# Patient Record
Sex: Female | Born: 1965
Health system: Southern US, Community
[De-identification: ages and names within clinical notes are randomized; demographics above are authoritative.]

## PROBLEM LIST (undated history)

## (undated) DIAGNOSIS — F419 Anxiety disorder, unspecified: Secondary | ICD-10-CM

## (undated) DIAGNOSIS — Z923 Personal history of irradiation: Secondary | ICD-10-CM

## (undated) DIAGNOSIS — N63 Unspecified lump in unspecified breast: Secondary | ICD-10-CM

## (undated) DIAGNOSIS — C50919 Malignant neoplasm of unspecified site of unspecified female breast: Secondary | ICD-10-CM

## (undated) DIAGNOSIS — C801 Malignant (primary) neoplasm, unspecified: Secondary | ICD-10-CM

## (undated) HISTORY — PX: BREAST SURGERY: SHX581

## (undated) HISTORY — PX: BREAST LUMPECTOMY: SHX2

---

## 1997-09-16 ENCOUNTER — Encounter: Admission: RE | Admit: 1997-09-16 | Discharge: 1997-10-08 | Payer: Self-pay | Admitting: Obstetrics and Gynecology

## 1999-07-15 ENCOUNTER — Other Ambulatory Visit: Admission: RE | Admit: 1999-07-15 | Discharge: 1999-07-15 | Payer: Self-pay | Admitting: Obstetrics and Gynecology

## 2000-07-06 ENCOUNTER — Other Ambulatory Visit: Admission: RE | Admit: 2000-07-06 | Discharge: 2000-07-06 | Payer: Self-pay | Admitting: Obstetrics and Gynecology

## 2001-08-01 ENCOUNTER — Other Ambulatory Visit: Admission: RE | Admit: 2001-08-01 | Discharge: 2001-08-01 | Payer: Self-pay | Admitting: Obstetrics and Gynecology

## 2001-08-04 ENCOUNTER — Encounter: Payer: Self-pay | Admitting: Obstetrics and Gynecology

## 2001-08-04 ENCOUNTER — Encounter: Admission: RE | Admit: 2001-08-04 | Discharge: 2001-08-04 | Payer: Self-pay | Admitting: Obstetrics and Gynecology

## 2001-09-18 ENCOUNTER — Encounter
Admission: RE | Admit: 2001-09-18 | Discharge: 2001-09-18 | Payer: Self-pay | Admitting: Physical Medicine & Rehabilitation

## 2001-09-18 ENCOUNTER — Encounter: Payer: Self-pay | Admitting: Physical Medicine & Rehabilitation

## 2002-07-31 ENCOUNTER — Other Ambulatory Visit: Admission: RE | Admit: 2002-07-31 | Discharge: 2002-07-31 | Payer: Self-pay | Admitting: Obstetrics and Gynecology

## 2003-08-06 ENCOUNTER — Other Ambulatory Visit
Admission: RE | Admit: 2003-08-06 | Discharge: 2003-08-06 | Payer: Self-pay | Admitting: Physical Medicine & Rehabilitation

## 2004-11-17 ENCOUNTER — Other Ambulatory Visit: Admission: RE | Admit: 2004-11-17 | Discharge: 2004-11-17 | Payer: Self-pay | Admitting: Obstetrics and Gynecology

## 2005-06-10 ENCOUNTER — Ambulatory Visit: Payer: Self-pay | Admitting: Family Medicine

## 2005-08-27 ENCOUNTER — Other Ambulatory Visit: Admission: RE | Admit: 2005-08-27 | Discharge: 2005-08-27 | Payer: Self-pay | Admitting: Obstetrics and Gynecology

## 2006-06-01 ENCOUNTER — Encounter: Admission: RE | Admit: 2006-06-01 | Discharge: 2006-06-01 | Payer: Self-pay | Admitting: Obstetrics and Gynecology

## 2006-07-04 ENCOUNTER — Encounter: Admission: RE | Admit: 2006-07-04 | Discharge: 2006-07-04 | Payer: Self-pay | Admitting: Obstetrics and Gynecology

## 2006-10-27 ENCOUNTER — Other Ambulatory Visit: Admission: RE | Admit: 2006-10-27 | Discharge: 2006-10-27 | Payer: Self-pay | Admitting: Obstetrics and Gynecology

## 2007-01-17 ENCOUNTER — Encounter: Admission: RE | Admit: 2007-01-17 | Discharge: 2007-01-17 | Payer: Self-pay | Admitting: Obstetrics and Gynecology

## 2007-06-07 ENCOUNTER — Encounter: Admission: RE | Admit: 2007-06-07 | Discharge: 2007-06-07 | Payer: Self-pay | Admitting: Obstetrics and Gynecology

## 2007-12-21 ENCOUNTER — Other Ambulatory Visit: Admission: RE | Admit: 2007-12-21 | Discharge: 2007-12-21 | Payer: Self-pay | Admitting: Obstetrics and Gynecology

## 2008-08-20 ENCOUNTER — Encounter: Admission: RE | Admit: 2008-08-20 | Discharge: 2008-08-20 | Payer: Self-pay | Admitting: Obstetrics and Gynecology

## 2009-01-07 ENCOUNTER — Other Ambulatory Visit: Admission: RE | Admit: 2009-01-07 | Discharge: 2009-01-07 | Payer: Self-pay | Admitting: Obstetrics and Gynecology

## 2009-11-14 ENCOUNTER — Encounter: Admission: RE | Admit: 2009-11-14 | Discharge: 2009-11-14 | Payer: Self-pay | Admitting: Obstetrics and Gynecology

## 2010-08-16 HISTORY — PX: AUGMENTATION MAMMAPLASTY: SUR837

## 2010-12-22 ENCOUNTER — Other Ambulatory Visit: Payer: Self-pay | Admitting: Obstetrics and Gynecology

## 2010-12-22 ENCOUNTER — Other Ambulatory Visit (HOSPITAL_COMMUNITY)
Admission: RE | Admit: 2010-12-22 | Discharge: 2010-12-22 | Disposition: A | Discharge status: Home / Self Care | Payer: BC Managed Care – PPO | Source: Ambulatory Visit | Attending: Obstetrics and Gynecology | Admitting: Obstetrics and Gynecology

## 2010-12-22 DIAGNOSIS — Z01419 Encounter for gynecological examination (general) (routine) without abnormal findings: Secondary | ICD-10-CM | POA: Insufficient documentation

## 2012-01-05 ENCOUNTER — Other Ambulatory Visit: Payer: Self-pay | Admitting: Obstetrics and Gynecology

## 2012-01-05 ENCOUNTER — Other Ambulatory Visit (HOSPITAL_COMMUNITY)
Admission: RE | Admit: 2012-01-05 | Discharge: 2012-01-05 | Disposition: A | Discharge status: Home / Self Care | Payer: PRIVATE HEALTH INSURANCE | Source: Ambulatory Visit | Attending: Obstetrics and Gynecology | Admitting: Obstetrics and Gynecology

## 2012-01-05 DIAGNOSIS — Z01419 Encounter for gynecological examination (general) (routine) without abnormal findings: Secondary | ICD-10-CM | POA: Insufficient documentation

## 2012-06-02 ENCOUNTER — Other Ambulatory Visit: Payer: Self-pay | Admitting: Obstetrics and Gynecology

## 2012-06-02 DIAGNOSIS — Z1231 Encounter for screening mammogram for malignant neoplasm of breast: Secondary | ICD-10-CM

## 2012-06-08 ENCOUNTER — Ambulatory Visit
Admission: RE | Admit: 2012-06-08 | Discharge: 2012-06-08 | Disposition: A | Discharge status: Home / Self Care | Payer: PRIVATE HEALTH INSURANCE | Source: Ambulatory Visit | Attending: Obstetrics and Gynecology | Admitting: Obstetrics and Gynecology

## 2012-06-08 DIAGNOSIS — Z1231 Encounter for screening mammogram for malignant neoplasm of breast: Secondary | ICD-10-CM

## 2013-01-04 ENCOUNTER — Other Ambulatory Visit: Payer: Self-pay | Admitting: Obstetrics and Gynecology

## 2013-01-04 ENCOUNTER — Other Ambulatory Visit (HOSPITAL_COMMUNITY)
Admission: RE | Admit: 2013-01-04 | Discharge: 2013-01-04 | Disposition: A | Discharge status: Home / Self Care | Payer: PRIVATE HEALTH INSURANCE | Source: Ambulatory Visit | Attending: Obstetrics and Gynecology | Admitting: Obstetrics and Gynecology

## 2013-01-04 DIAGNOSIS — Z1151 Encounter for screening for human papillomavirus (HPV): Secondary | ICD-10-CM | POA: Insufficient documentation

## 2013-01-04 DIAGNOSIS — Z01419 Encounter for gynecological examination (general) (routine) without abnormal findings: Secondary | ICD-10-CM | POA: Insufficient documentation

## 2013-07-17 ENCOUNTER — Other Ambulatory Visit: Payer: Self-pay

## 2013-07-17 DIAGNOSIS — Z1231 Encounter for screening mammogram for malignant neoplasm of breast: Secondary | ICD-10-CM

## 2013-08-17 ENCOUNTER — Other Ambulatory Visit: Payer: Self-pay

## 2013-08-17 ENCOUNTER — Ambulatory Visit
Admission: RE | Admit: 2013-08-17 | Discharge: 2013-08-17 | Disposition: A | Discharge status: Home / Self Care | Payer: PRIVATE HEALTH INSURANCE | Source: Ambulatory Visit

## 2013-08-17 ENCOUNTER — Ambulatory Visit: Payer: PRIVATE HEALTH INSURANCE

## 2013-08-17 DIAGNOSIS — Z1231 Encounter for screening mammogram for malignant neoplasm of breast: Secondary | ICD-10-CM

## 2014-01-18 ENCOUNTER — Other Ambulatory Visit (HOSPITAL_COMMUNITY)
Admission: RE | Admit: 2014-01-18 | Discharge: 2014-01-18 | Disposition: A | Discharge status: Home / Self Care | Payer: PRIVATE HEALTH INSURANCE | Source: Ambulatory Visit | Attending: Obstetrics and Gynecology | Admitting: Obstetrics and Gynecology

## 2014-01-18 ENCOUNTER — Other Ambulatory Visit: Payer: Self-pay | Admitting: Obstetrics and Gynecology

## 2014-01-18 DIAGNOSIS — Z01419 Encounter for gynecological examination (general) (routine) without abnormal findings: Secondary | ICD-10-CM | POA: Insufficient documentation

## 2014-01-21 LAB — CYTOLOGY - PAP

## 2014-10-23 ENCOUNTER — Other Ambulatory Visit: Payer: Self-pay

## 2014-10-23 DIAGNOSIS — Z1231 Encounter for screening mammogram for malignant neoplasm of breast: Secondary | ICD-10-CM

## 2014-11-11 ENCOUNTER — Ambulatory Visit
Admission: RE | Admit: 2014-11-11 | Discharge: 2014-11-11 | Disposition: A | Discharge status: Home / Self Care | Payer: PRIVATE HEALTH INSURANCE | Source: Ambulatory Visit

## 2014-11-11 DIAGNOSIS — Z1231 Encounter for screening mammogram for malignant neoplasm of breast: Secondary | ICD-10-CM

## 2015-03-21 ENCOUNTER — Other Ambulatory Visit (HOSPITAL_COMMUNITY)
Admission: RE | Admit: 2015-03-21 | Discharge: 2015-03-21 | Disposition: A | Discharge status: Home / Self Care | Payer: PRIVATE HEALTH INSURANCE | Source: Ambulatory Visit | Attending: Obstetrics and Gynecology | Admitting: Obstetrics and Gynecology

## 2015-03-21 ENCOUNTER — Other Ambulatory Visit: Payer: Self-pay | Admitting: Obstetrics and Gynecology

## 2015-03-21 DIAGNOSIS — Z01419 Encounter for gynecological examination (general) (routine) without abnormal findings: Secondary | ICD-10-CM | POA: Diagnosis not present

## 2015-03-24 LAB — CYTOLOGY - PAP

## 2015-12-08 ENCOUNTER — Other Ambulatory Visit: Payer: Self-pay | Admitting: Internal Medicine

## 2015-12-08 DIAGNOSIS — Z130 Encounter for screening for diseases of the blood and blood-forming organs and certain disorders involving the immune mechanism: Secondary | ICD-10-CM

## 2015-12-08 DIAGNOSIS — Z13220 Encounter for screening for lipoid disorders: Secondary | ICD-10-CM

## 2015-12-08 DIAGNOSIS — Z0000 Encounter for general adult medical examination without abnormal findings: Secondary | ICD-10-CM

## 2015-12-08 DIAGNOSIS — Z1329 Encounter for screening for other suspected endocrine disorder: Secondary | ICD-10-CM

## 2015-12-08 DIAGNOSIS — Z1321 Encounter for screening for nutritional disorder: Secondary | ICD-10-CM

## 2015-12-08 LAB — TSH: TSH: 1.64 m[IU]/L

## 2015-12-08 LAB — COMPLETE METABOLIC PANEL WITH GFR
ALBUMIN: 4.3 g/dL (ref 3.6–5.1)
ALK PHOS: 75 U/L (ref 33–130)
ALT: 30 U/L — ABNORMAL HIGH (ref 6–29)
AST: 27 U/L (ref 10–35)
BUN: 10 mg/dL (ref 7–25)
CHLORIDE: 106 mmol/L (ref 98–110)
CO2: 27 mmol/L (ref 20–31)
Calcium: 9.4 mg/dL (ref 8.6–10.4)
Creat: 0.76 mg/dL (ref 0.50–1.05)
GFR, Est African American: >89 mL/min (ref >=60)
GLUCOSE: 83 mg/dL (ref 65–99)
POTASSIUM: 4.3 mmol/L (ref 3.5–5.3)
SODIUM: 143 mmol/L (ref 135–146)
Total Bilirubin: 0.8 mg/dL (ref 0.2–1.2)
Total Protein: 6.5 g/dL (ref 6.1–8.1)

## 2015-12-08 LAB — LIPID PANEL
CHOLESTEROL: 184 mg/dL (ref 125–200)
HDL: 92 mg/dL (ref >=46)
LDL Cholesterol: 68 mg/dL (ref <130)
Total CHOL/HDL Ratio: 2 Ratio (ref <=5.0)
Triglycerides: 118 mg/dL (ref <150)
VLDL: 24 mg/dL (ref <30)

## 2015-12-08 LAB — CBC WITH DIFFERENTIAL/PLATELET
BASOS ABS: 0 {cells}/uL (ref 0–200)
Basophils Relative: 0 %
EOS PCT: 4 %
Eosinophils Absolute: 188 cells/uL (ref 15–500)
HCT: 42.6 % (ref 35.0–45.0)
HEMOGLOBIN: 14.3 g/dL (ref 11.7–15.5)
LYMPHS ABS: 1927 {cells}/uL (ref 850–3900)
LYMPHS PCT: 41 %
MCH: 30.8 pg (ref 27.0–33.0)
MCHC: 33.6 g/dL (ref 32.0–36.0)
MCV: 91.6 fL (ref 80.0–100.0)
MONOS PCT: 10 %
MPV: 9.9 fL (ref 7.5–12.5)
Monocytes Absolute: 470 cells/uL (ref 200–950)
NEUTROS PCT: 45 %
Neutro Abs: 2115 cells/uL (ref 1500–7800)
Platelets: 242 10*3/uL (ref 140–400)
RBC: 4.65 MIL/uL (ref 3.80–5.10)
RDW: 13.3 % (ref 11.0–15.0)
WBC: 4.7 10*3/uL (ref 3.8–10.8)

## 2015-12-09 LAB — VITAMIN D 25 HYDROXY (VIT D DEFICIENCY, FRACTURES): Vit D, 25-Hydroxy: 24 ng/mL — ABNORMAL LOW (ref 30–100)

## 2015-12-11 ENCOUNTER — Other Ambulatory Visit (HOSPITAL_COMMUNITY)
Admission: RE | Admit: 2015-12-11 | Discharge: 2015-12-11 | Disposition: A | Discharge status: Home / Self Care | Payer: PRIVATE HEALTH INSURANCE | Source: Ambulatory Visit | Attending: Internal Medicine | Admitting: Internal Medicine

## 2015-12-11 ENCOUNTER — Ambulatory Visit (INDEPENDENT_AMBULATORY_CARE_PROVIDER_SITE_OTHER): Payer: PRIVATE HEALTH INSURANCE | Admitting: Internal Medicine

## 2015-12-11 ENCOUNTER — Encounter: Payer: Self-pay | Admitting: Internal Medicine

## 2015-12-11 ENCOUNTER — Other Ambulatory Visit: Payer: Self-pay | Admitting: Internal Medicine

## 2015-12-11 VITALS — BP 118/88 | HR 75 | Temp 98.6°F | Resp 18 | Ht 65.5 in | Wt 139.5 lb

## 2015-12-11 DIAGNOSIS — Z124 Encounter for screening for malignant neoplasm of cervix: Secondary | ICD-10-CM

## 2015-12-11 DIAGNOSIS — F411 Generalized anxiety disorder: Secondary | ICD-10-CM | POA: Diagnosis not present

## 2015-12-11 DIAGNOSIS — Z0000 Encounter for general adult medical examination without abnormal findings: Secondary | ICD-10-CM

## 2015-12-11 DIAGNOSIS — R0789 Other chest pain: Secondary | ICD-10-CM | POA: Diagnosis not present

## 2015-12-11 DIAGNOSIS — B360 Pityriasis versicolor: Secondary | ICD-10-CM | POA: Diagnosis not present

## 2015-12-11 DIAGNOSIS — Z01419 Encounter for gynecological examination (general) (routine) without abnormal findings: Secondary | ICD-10-CM | POA: Insufficient documentation

## 2015-12-11 MED ORDER — ALPRAZOLAM 0.5 MG PO TABS: ORAL_TABLET | ORAL | Status: DC

## 2015-12-11 MED ORDER — KETOCONAZOLE 2 % EX SHAM: MEDICATED_SHAMPOO | CUTANEOUS | Status: DC

## 2015-12-11 MED ORDER — ESCITALOPRAM OXALATE 10 MG PO TABS: 10.0000 mg | ORAL_TABLET | Freq: Every day | ORAL | Status: DC

## 2015-12-11 MED ORDER — KETOCONAZOLE 2 % EX SHAM: 1.0000 "application " | MEDICATED_SHAMPOO | CUTANEOUS | Status: DC

## 2015-12-11 NOTE — Progress Notes (Signed)
Subjective:    Patient ID: [name], female    DOB: 1966-08-04, 50 y.o.   MRN: [id number]  HPI 50 year old White Female presents to the office for the first time today. Both of her sisters are patients here. One sister  Is  [name]  who is married to Dr. Arloa Koh, Radiation Oncologist. Patient's husband arrested September 2016 enroute to the Emergency Department after calling EMS with complaint of chest pain. He apparenty expired of a sudden cardiac event. He was 51 years old.Patient was not home at the time. She was traveling for work. She did speak with him and advised him to call for help. She has 2 sons. She continues to work full-time. She's done amazingly well.  However recently she's had some chest pain as well. She would like to get this evaluated. Maternal grandmother had history of heart disease.  Patient says she has a history of polycystic ovary syndrome. GYN records do not say that. They indicate she has a history of an ovarian cyst. Was on oral contraceptives until about 2 months ago. Has not had a menstrual period since stopping oral contraceptives. Has seen [name] in August 2016.  She had transvaginal ultrasound in 2002 that showed a right ovarian cyst She also had multiple clear follicular cyst both ovaries. By 2003 transvaginal ultrasound showed resolution of right ovarian cyst.  History of bilateral breast implants 2012  Patient does not smoke. Social alcohol consumption. She is senior Tree surgeon for [employer]  Family history: Father died at age 59 due to myeloproliferative disorder. Mother healthy. 2 sisters ages 15 and 46 in excellent health. 2 sons ages 31 and 9 both in good health.  GYN records indicate she has history of HSV treated with Valtrex when necessary  Patient previously was on killed S FE 1.5/30 oral contraceptives until 2 months ago.  Last mammogram March 2016  Was tried on Lexapro after husband passed away but did not  take it very long         Review of Systems last menstrual period was 2 months ago when she was on oral contraceptives. No history of significant hirsutism. May be some slight hair thinning     Objective:   Physical Exam  Constitutional: She is oriented to person, place, and time. She appears well-developed and well-nourished. No distress.  HENT:  Head: Normocephalic and atraumatic.  Right Ear: External ear normal.  Left Ear: External ear normal.  Mouth/Throat: No oropharyngeal exudate.  Eyes: Conjunctivae and EOM are normal. Pupils are equal, round, and reactive to light. Right eye exhibits no discharge. Left eye exhibits no discharge.  Neck: Neck supple. No JVD present. No thyromegaly present.  Cardiovascular: Normal rate, regular rhythm and normal heart sounds.   No murmur heard. Pulmonary/Chest: Effort normal and breath sounds normal. No respiratory distress. She has no wheezes. She has no rales.  Bilateral breast implants  Abdominal: Soft. Bowel sounds are normal. She exhibits no distension and no mass. There is no tenderness. There is no rebound and no guarding.  Genitourinary:  Pap taken. Bimanual normal.  Musculoskeletal: She exhibits no edema.  Lymphadenopathy:    She has no cervical adenopathy.  Neurological: She is alert and oriented to person, place, and time. She has normal reflexes. No cranial nerve deficit. Coordination normal.  Skin: Skin is warm and dry. She is not diaphoretic.  Tinea versicolor  Psychiatric: She has a normal mood and affect. Her behavior is normal. Judgment  and thought content normal.  Vitals reviewed.         Assessment & Plan:  History of polycystic ovary syndrome-patient wants definitive diagnosis. We'll discuss at next visit how to go about evaluating this further. Really doesn't have any significant hirsutism. Consider serum testosterone level. Consider screening for diabetes. Triglycerides are normal.  Grief reaction-have  prescribed Lexapro 10 mg daily and when necessary Xanax for her. Referred to [name], psychologist  Chest pain-refer to cardiology for further evaluation. EKG today is within normal limits but she needs some reassurance.  Health maintenance-she'll have colonoscopy this summer with [name]  Tinea versicolor-prescribed Ketoconazole shampoo  History of right ovarian cyst which subsequently resolved 2003  Plan: Return in 4 weeks for follow-up

## 2015-12-11 NOTE — Patient Instructions (Addendum)
Start Lexapro 10 mg daily. Prescription for Xanax to take as needed as for anxiety. Follow-up in 4 weeks. Suggested Doroteo Glassman for counseling. We'll make cardiology appointment for evaluation. [place] will be added to your lab work.

## 2015-12-12 ENCOUNTER — Other Ambulatory Visit: Payer: PRIVATE HEALTH INSURANCE | Admitting: Internal Medicine

## 2015-12-12 DIAGNOSIS — L680 Hirsutism: Secondary | ICD-10-CM

## 2015-12-12 LAB — FOLLICLE STIMULATING HORMONE: FSH: 122.5 m[IU]/mL — AB

## 2015-12-12 LAB — TESTOSTERONE: TESTOSTERONE: 42 ng/dL

## 2015-12-15 LAB — CYTOLOGY - PAP

## 2016-02-29 ENCOUNTER — Other Ambulatory Visit: Payer: Self-pay | Admitting: Internal Medicine

## 2016-02-29 NOTE — Telephone Encounter (Signed)
Refill x 0ne year

## 2016-04-07 ENCOUNTER — Other Ambulatory Visit: Payer: Self-pay | Admitting: Internal Medicine

## 2016-04-16 ENCOUNTER — Ambulatory Visit (INDEPENDENT_AMBULATORY_CARE_PROVIDER_SITE_OTHER): Payer: PRIVATE HEALTH INSURANCE | Admitting: Internal Medicine

## 2016-04-16 ENCOUNTER — Encounter: Payer: Self-pay | Admitting: Internal Medicine

## 2016-04-16 VITALS — BP 110/84 | HR 67 | Temp 98.1°F | Ht 65.5 in | Wt 143.5 lb

## 2016-04-16 DIAGNOSIS — H6693 Otitis media, unspecified, bilateral: Secondary | ICD-10-CM

## 2016-04-16 DIAGNOSIS — J069 Acute upper respiratory infection, unspecified: Secondary | ICD-10-CM

## 2016-04-16 MED ORDER — METHYLPREDNISOLONE ACETATE 80 MG/ML IJ SUSP
80.0000 mg | Freq: Once | INTRAMUSCULAR | Status: AC
  Administered 2016-04-16: 80 mg via INTRAMUSCULAR

## 2016-04-16 MED ORDER — AZITHROMYCIN 250 MG PO TABS: ORAL_TABLET | ORAL | 0 refills | Status: DC

## 2016-04-16 NOTE — Patient Instructions (Signed)
Depo-Medrol 80 mg IM. Zithromax Z-Pak take 2 tablets day one followed by 1 tablet days 2 through 5.

## 2016-05-03 ENCOUNTER — Other Ambulatory Visit: Payer: Self-pay | Admitting: Obstetrics and Gynecology

## 2016-05-03 ENCOUNTER — Other Ambulatory Visit: Payer: Self-pay | Admitting: Internal Medicine

## 2016-05-03 DIAGNOSIS — Z1231 Encounter for screening mammogram for malignant neoplasm of breast: Secondary | ICD-10-CM

## 2016-05-03 DIAGNOSIS — Z9882 Breast implant status: Secondary | ICD-10-CM

## 2016-05-12 NOTE — Progress Notes (Signed)
   Subjective:    Patient ID: [name], female    DOB: [birth year], 50 y.o.   MRN: [id number]  HPI Patient in today with 7 day history of URI symptoms that simply will not go away. Has cough. No fever or shaking chills.    Review of Systems as above     Objective:   Physical Exam  Skin warm and dry. TMs dull bilaterally particular on the left. Pharynx is slightly injected without exudate.      Assessment & Plan:  Acute URI  Acute maxillary sinusitis  Bilateral acute otitis media  Plan: Zithromax Z-Pak take 2 tablets day one followed by 1 tablet days 2 through 5. Depo-Medrol 80 mg IM to help with sinus congestion and pressure.

## 2016-05-21 ENCOUNTER — Ambulatory Visit
Admission: RE | Admit: 2016-05-21 | Discharge: 2016-05-21 | Disposition: A | Discharge status: Home / Self Care | Payer: PRIVATE HEALTH INSURANCE | Source: Ambulatory Visit | Attending: Internal Medicine | Admitting: Internal Medicine

## 2016-05-21 ENCOUNTER — Encounter: Payer: Self-pay | Admitting: Cardiovascular Disease

## 2016-05-21 ENCOUNTER — Ambulatory Visit (INDEPENDENT_AMBULATORY_CARE_PROVIDER_SITE_OTHER): Payer: PRIVATE HEALTH INSURANCE | Admitting: Cardiovascular Disease

## 2016-05-21 VITALS — BP 114/58 | HR 72 | Ht 66.0 in | Wt 142.4 lb

## 2016-05-21 DIAGNOSIS — Z1231 Encounter for screening mammogram for malignant neoplasm of breast: Secondary | ICD-10-CM

## 2016-05-21 DIAGNOSIS — Z9189 Other specified personal risk factors, not elsewhere classified: Secondary | ICD-10-CM

## 2016-05-21 DIAGNOSIS — Z9882 Breast implant status: Secondary | ICD-10-CM

## 2016-05-21 NOTE — Patient Instructions (Addendum)
  Coronary Calcium Scan A coronary calcium scan is an imaging test used to look for deposits of calcium and other fatty materials (plaques) in the inner lining of the blood vessels of your heart (coronary arteries). These deposits of calcium and plaques can partly clog and narrow the coronary arteries without producing any symptoms or warning signs. This puts you at risk for a heart attack. This test can detect these deposits before symptoms develop.  LET [facility] CARE PROVIDER KNOW ABOUT:  Any allergies you have.  All medicines you are taking, including vitamins, herbs, eye drops, creams, and over-the-counter medicines.  Previous problems you or members of your family have had with the use of anesthetics.  Any blood disorders you have.  Previous surgeries you have had.  Medical conditions you have.  Possibility of pregnancy, if this applies. RISKS AND COMPLICATIONS Generally, this is a safe procedure. However, as with any procedure, complications can occur. This test involves the use of radiation. Radiation exposure can be dangerous to a pregnant woman and her unborn baby. If you are pregnant, you should not have this procedure done.  BEFORE THE PROCEDURE There is no special preparation for the procedure. PROCEDURE  You will need to undress and put on a hospital gown. You will need to remove any jewelry around your neck or chest.  Sticky electrodes are placed on your chest and are connected to an electrocardiogram (EKG or electrocardiography) machine to recorda tracing of the electrical activity of your heart.  A CT scanner will take pictures of your heart. During this time, you will be asked to lie still and hold your breath for 2-3 seconds while a picture is being taken of your heart. AFTER THE PROCEDURE   You will be allowed to get dressed.  You can return to your normal activities after the scan is done.   This information is not intended to replace advice given to you by  your health care provider. Make sure you discuss any questions you have with your health care provider.   Document Released: 01/29/2008 Document Revised: 08/07/2013 Document Reviewed: 04/09/2013 Elsevier Interactive Patient Education 2016 [employer].     Medication Instructions:  Your physician recommends that you continue on your current medications as directed. Please refer to the Current Medication list given to you today.  Labwork: No new orders.   Testing/Procedures: Please contact the office if you would like to proceed with Coronary Calcium Score.  Follow-Up: Your physician recommends that you schedule a follow-up appointment as needed with Dr Oval Linsey.    Any Other Special Instructions Will Be Listed Below (If Applicable).     If you need a refill on your cardiac medications before your next appointment, please call your pharmacy.

## 2016-05-21 NOTE — Progress Notes (Signed)
Cardiology Office Note   Date:  05/21/2016   ID:  [name], DOB 1965/08/28, MRN [id number]  PCP:  Elby Showers, MD  Cardiologist:   Skeet Latch, MD   Chief Complaint  Patient presents with  . New patient/cardiac check-up spouse had sudden cardiac death    Pt would like evaluation to assess for cardiac risk factors      History of Present Illness: [name] is a 50 y.o. female with no past medical history who presents for cardiovascular risk assessment.  [name] 63 year old husband died suddenly of a heart attack last year.  He was in good physical condition and his only medical problem with hyperlipidemia that was well-controlled on a statin.  He had no warning signs (that he shared with his wife), and she is concerned that she could have coronary disease that will kill her as well.  [name] exercises 5-6 days per week. She has no chest pain or shortness of breath with this activity. Soon after her husband's death she was taking Lexapro which she stopped abruptly. For a few days following this change she did have some chest pain. However she has not had any in several months.  She notes some mild lower extremity edema with travel, but it improves with elevation of her legs.  She denies orhtopnea or PND.  [name] grandfather died of a heart attack in his mid 72s. She has 2 sons, (one in college and one adult son) were both in good health.  No past medical history on file.  Past Surgical History:  Procedure Laterality Date  . BREAST SURGERY     implant Dec 2012     Current Outpatient Prescriptions  Medication Sig Dispense Refill  . ketoconazole (NIZORAL) 2 % shampoo APPLY EXTERNALLY 2 TIMES A WEEK 120 mL 11  . nitrofurantoin, macrocrystal-monohydrate, (MACROBID) 100 MG capsule Take 1 capsule by mouth as needed.    . ALPRAZolam (XANAX) 0.5 MG tablet One half to one  tab po bid prn anxiety (Patient not taking: Reported on 05/21/2016) 60 tablet 1  .  escitalopram (LEXAPRO) 10 MG tablet TAKE 1 TABLET(10 MG) BY MOUTH DAILY (Patient not taking: Reported on 05/21/2016) 90 tablet 1   No current facility-administered medications for this visit.     Allergies:   Review of patient's allergies indicates no known allergies.    Social History:  The patient  reports that she has never smoked. She has never used smokeless tobacco. She reports that she drinks about 4.2 oz of alcohol per week . She reports that she does not use drugs.   Family History:  The patient's family history includes Healthy in her mother; Heart disease in her maternal grandmother; Myelodysplastic syndrome in her father.    ROS:  Please see the history of present illness.   Otherwise, review of systems are positive for none.   All other systems are reviewed and negative.    PHYSICAL EXAM: VS:  BP (!) 114/58   Pulse 72   Ht 5\' 6"  (1.676 m)   Wt 142 lb 6 oz (64.6 kg)   BMI 22.98 kg/m  , BMI Body mass index is 22.98 kg/m. GENERAL:  Well appearing HEENT:  Pupils equal round and reactive, fundi not visualized, oral mucosa unremarkable NECK:  No jugular venous distention, waveform within normal limits, carotid upstroke brisk and symmetric, no bruits, no thyromegaly LYMPHATICS:  No cervical adenopathy LUNGS:  Clear to auscultation bilaterally HEART:  RRR.  PMI not displaced or sustained,S1 and S2 within normal limits, no S3, no S4, no clicks, no rubs, no murmurs ABD:  Flat, positive bowel sounds normal in frequency in pitch, no bruits, no rebound, no guarding, no midline pulsatile mass, no hepatomegaly, no splenomegaly EXT:  2 plus pulses throughout, no edema, no cyanosis no clubbing SKIN:  No rashes no nodules NEURO:  Cranial nerves II through XII grossly intact, motor grossly intact throughout PSYCH:  Cognitively intact, oriented to person place and time    EKG:  EKG is not ordered today. The ekg ordered 12/11/15 demonstrates sinus rhythm.  Rate 72 bpm.    Recent  Labs: 12/08/2015: ALT 30; BUN 10; Creat 0.76; Hemoglobin 14.3; Platelets 242; Potassium 4.3; Sodium 143; TSH 1.64    Lipid Panel    Component Value Date/Time   CHOL 184 12/08/2015 1122   TRIG 118 12/08/2015 1122   HDL 92 12/08/2015 1122   CHOLHDL 2.0 12/08/2015 1122   VLDL 24 12/08/2015 1122   LDLCALC 68 12/08/2015 1122      Wt Readings from Last 3 Encounters:  05/21/16 142 lb 6 oz (64.6 kg)  04/16/16 143 lb 8 oz (65.1 kg)  12/11/15 139 lb 8 oz (63.3 kg)      ASSESSMENT AND PLAN:  # Cardiovascular risk:  [name] has no cardiovascular risk factors and was a very healthy lifestyle. Her ASCVD 10 year risk is 0.5%. We discussed the fact that these are all still statistics, and we cannot guarantee that she does not have any coronary disease. However, the likelihood of her having a coronary event in the next 10 years it is exceedingly rare. We discussed coronary calcium scoring as an alternative if she really wants to know the true status of her coronaries. She will consider this and get back to [place] if she wants to pursue this option.    Current medicines are reviewed at length with the patient today.  The patient does not have concerns regarding medicines.  The following changes have been made:  no change  Labs/ tests ordered today include:  No orders of the defined types were placed in this encounter.   Time spent: 45 minutes-Greater than 50% of this time was spent in counseling, explanation of diagnosis, planning of further management, and coordination of care.   Disposition:   FU with Chanler Mendonca C. Oval Linsey, MD, [facility] as needed.     This note was written with the assistance of speech recognition software.  Please excuse any transcriptional errors.  Signed, Nevan Creighton C. Oval Linsey, MD, [facility]  05/21/2016 5:55 PM    [place]

## 2016-07-20 ENCOUNTER — Telehealth: Payer: Self-pay | Admitting: Internal Medicine

## 2016-07-20 NOTE — Telephone Encounter (Signed)
Patient states that you did a test regarding menopause.  She has been bleeding for the past 5 weeks consistently.  States that she has a normal period and this is on top of that period.  She states that she probably goes through one tampon for the day during this time.    Patient does have a GYN.  Advised patient to contact her GYN.    FYI.

## 2016-08-05 ENCOUNTER — Encounter: Payer: Self-pay | Admitting: Internal Medicine

## 2016-08-05 ENCOUNTER — Ambulatory Visit (INDEPENDENT_AMBULATORY_CARE_PROVIDER_SITE_OTHER): Payer: PRIVATE HEALTH INSURANCE | Admitting: Internal Medicine

## 2016-08-05 ENCOUNTER — Other Ambulatory Visit: Payer: Self-pay | Admitting: Internal Medicine

## 2016-08-05 VITALS — BP 100/68 | HR 68 | Temp 98.3°F | Ht 66.0 in | Wt 145.0 lb

## 2016-08-05 DIAGNOSIS — M5432 Sciatica, left side: Secondary | ICD-10-CM | POA: Diagnosis not present

## 2016-08-05 MED ORDER — MELOXICAM 15 MG PO TABS: 15.0000 mg | ORAL_TABLET | Freq: Every day | ORAL | 0 refills | Status: DC

## 2016-08-05 MED ORDER — CYCLOBENZAPRINE HCL 10 MG PO TABS: 10.0000 mg | ORAL_TABLET | Freq: Three times a day (TID) | ORAL | 0 refills | Status: DC | PRN

## 2016-08-05 NOTE — Telephone Encounter (Signed)
Please call does not need 90 day supply

## 2016-08-05 NOTE — Progress Notes (Signed)
   Subjective:    Patient ID: [name], female    DOB: Jan 15, 1966, 50 y.o.   MRN: [id number]  HPI  50 year old Female Has been working out on the elliptical machine and has developed some low back pain radiating into left buttock. No weakness or numbness in the left lower extremity. Has tried to back off of exercise regimen a bit. Pain has not resolved.    Review of Systems see above     Objective:   Physical Exam Deep tendon reflexes in the knees are 2+ and symmetrical. Muscle strength in the lower extremities is normal. Sensation intact in the lower extremities.       Assessment & Plan:  Left sciatica  Plan: Offered a short course of steroids but she declined. We will try Mobic 15 mg daily and Flexeril 10 mg up to 3 times daily as needed for muscle spasm.  May benefit from course of physical therapy. Take it easy on exercise regimen for now until pain has improved. Order written for physical therapy.

## 2016-08-12 NOTE — Patient Instructions (Addendum)
Order written for physical therapy. Mobic 15 mg daily. Flexeril up to 3 times daily as needed for muscle spasm. Take it easy on exercise regimen. Avoid elliptical machine for now.

## 2016-08-19 ENCOUNTER — Telehealth: Payer: Self-pay | Admitting: Internal Medicine

## 2016-08-19 MED ORDER — PREDNISONE 10 MG PO TABS: ORAL_TABLET | ORAL | 0 refills | Status: DC

## 2016-08-19 NOTE — Telephone Encounter (Signed)
Patient called in regards to trying Prednisone for her sciatic nerve pain. The other medicine isn't working. Patient uses the [facility] on [address]. Please advise.

## 2016-08-19 NOTE — Telephone Encounter (Signed)
We will call in a six-day Sterapred DS 10 mg dosepak. If symptoms persist, she may need see orthopedist

## 2016-09-10 ENCOUNTER — Ambulatory Visit (INDEPENDENT_AMBULATORY_CARE_PROVIDER_SITE_OTHER): Payer: PRIVATE HEALTH INSURANCE | Admitting: Internal Medicine

## 2016-09-10 VITALS — BP 92/70 | HR 65 | Temp 97.9°F | Wt 148.0 lb

## 2016-09-10 DIAGNOSIS — Z23 Encounter for immunization: Secondary | ICD-10-CM

## 2016-09-10 NOTE — Progress Notes (Signed)
Flu vaccine given.

## 2016-09-12 NOTE — Patient Instructions (Signed)
Flu vaccine given.

## 2016-11-11 ENCOUNTER — Other Ambulatory Visit: Payer: Self-pay | Admitting: Internal Medicine

## 2016-11-22 ENCOUNTER — Telehealth: Payer: Self-pay | Admitting: Internal Medicine

## 2016-11-22 NOTE — Telephone Encounter (Signed)
Needs OV.  

## 2016-11-22 NOTE — Telephone Encounter (Signed)
Patient is requesting a refill on a dose pack for sciatica.  States that this helped her pain in the past couple of weeks.  Advised patient this may require an office visit.    Pharmacy:  [place] number for contact:  [phone]

## 2016-11-23 NOTE — Telephone Encounter (Signed)
Left patient a message @ [phone] to contact the office to set up an appointment to come in for the sciatica pain.

## 2017-01-03 ENCOUNTER — Telehealth: Payer: Self-pay | Admitting: Internal Medicine

## 2017-01-03 DIAGNOSIS — G4700 Insomnia, unspecified: Secondary | ICD-10-CM

## 2017-01-03 NOTE — Telephone Encounter (Addendum)
Prescribe Ambien 10 mg #30 with no refill one po qhs

## 2017-01-03 NOTE — Telephone Encounter (Signed)
Patient has not taken Ambien before per her med history, please advise

## 2017-01-03 NOTE — Telephone Encounter (Signed)
Patient is calling to request a refill on her Ambien.  She is going to [place].  Wanted to be sure she had medication to take with her.    Pharmacy:  [facility] on [address] [facility] [address]  Thank you.

## 2017-01-05 ENCOUNTER — Telehealth: Payer: Self-pay

## 2017-01-05 MED ORDER — ZOLPIDEM TARTRATE 10 MG PO TABS: 10.0000 mg | ORAL_TABLET | Freq: Every evening | ORAL | 0 refills | Status: DC | PRN

## 2017-01-05 NOTE — Telephone Encounter (Signed)
Pt called and stated that the pharmacy had not received her prescription, I called and gave it to them a second time over the phone. Pt is aware.

## 2017-01-07 ENCOUNTER — Other Ambulatory Visit: Payer: Self-pay | Admitting: Internal Medicine

## 2017-01-31 ENCOUNTER — Ambulatory Visit (INDEPENDENT_AMBULATORY_CARE_PROVIDER_SITE_OTHER): Payer: PRIVATE HEALTH INSURANCE | Admitting: Internal Medicine

## 2017-01-31 ENCOUNTER — Encounter: Payer: Self-pay | Admitting: Internal Medicine

## 2017-01-31 VITALS — BP 102/80 | HR 66 | Temp 98.4°F

## 2017-01-31 DIAGNOSIS — J069 Acute upper respiratory infection, unspecified: Secondary | ICD-10-CM

## 2017-01-31 DIAGNOSIS — Z7184 Encounter for health counseling related to travel: Secondary | ICD-10-CM

## 2017-01-31 DIAGNOSIS — Z7189 Other specified counseling: Secondary | ICD-10-CM | POA: Diagnosis not present

## 2017-01-31 DIAGNOSIS — B009 Herpesviral infection, unspecified: Secondary | ICD-10-CM | POA: Diagnosis not present

## 2017-01-31 DIAGNOSIS — H6503 Acute serous otitis media, bilateral: Secondary | ICD-10-CM | POA: Diagnosis not present

## 2017-01-31 MED ORDER — VALACYCLOVIR HCL 500 MG PO TABS: 500.0000 mg | ORAL_TABLET | Freq: Two times a day (BID) | ORAL | 3 refills | Status: DC

## 2017-01-31 MED ORDER — NITROFURANTOIN MONOHYD MACRO 100 MG PO CAPS: ORAL_CAPSULE | ORAL | 1 refills | Status: DC

## 2017-01-31 MED ORDER — AZITHROMYCIN 250 MG PO TABS: ORAL_TABLET | ORAL | 1 refills | Status: DC

## 2017-01-31 MED ORDER — MELOXICAM 15 MG PO TABS: ORAL_TABLET | ORAL | 0 refills | Status: DC

## 2017-01-31 MED ORDER — CYCLOBENZAPRINE HCL 10 MG PO TABS: 10.0000 mg | ORAL_TABLET | Freq: Three times a day (TID) | ORAL | 0 refills | Status: DC | PRN

## 2017-01-31 MED ORDER — PREDNISONE 10 MG PO TABS: ORAL_TABLET | ORAL | 0 refills | Status: DC

## 2017-01-31 NOTE — Progress Notes (Signed)
   Subjective:    Patient ID: DA MICHELLE, female    DOB: [birth year], 51 y.o.   MRN: [id number]  HPI 51 year old Female in today with URI symptoms and ear pain. Has developed a lesion on her lip that very well may be HSV type I. In the past, says similar lesions have responded to Valtrex.  Will be traveling [place] in the near future and needs antibiotics for travel.     Review of Systems see above     Objective:   Physical Exam Skin warm and dry. Nodes none. Neck is supple. No significant adenopathy. Chest clear. TMs slightly full left more than right. Pharynx is slightly injected without exudate. Lesion on upper lip has mostly resolved. Suspect HSV type one       Assessment & Plan:  Bilateral serous otitis media  Herpes simplex type I  Travel overseas forthcoming  Plan: Ambien 10 mg #30 one by mouth daily at bedtime for travel. Valtrex 500 mg twice daily for 5 days for recurrence of lip lesion. Macrobid 100 mg twice daily for 10 days for potential UTI while on trip. Meloxicam 15 mg #90 one by mouth daily for musculoskeletal pain. Sterapred DS 10 mg 6 day dosepak to take in tapering course as directed for respiratory infection symptoms. Zithromax Z-PAK for respiratory infection symptoms and otitis media. Refill Flexeril for musculoskeletal pain.  Time spent 25 minutes

## 2017-02-01 MED ORDER — PREDNISONE 10 MG PO TABS: ORAL_TABLET | ORAL | 0 refills | Status: DC

## 2017-02-09 NOTE — Patient Instructions (Addendum)
For travel, given Macrobid, Flexeril, Meloxicam, Ambien. For URI, treated with Zithromax Z-Pak and Prednisone. For HSV type I, given Valtrex.

## 2017-03-26 DIAGNOSIS — N63 Unspecified lump in unspecified breast: Secondary | ICD-10-CM

## 2017-03-26 HISTORY — DX: Unspecified lump in unspecified breast: N63.0

## 2017-03-29 ENCOUNTER — Other Ambulatory Visit: Payer: Self-pay | Admitting: Obstetrics and Gynecology

## 2017-03-29 DIAGNOSIS — N6320 Unspecified lump in the left breast, unspecified quadrant: Secondary | ICD-10-CM

## 2017-03-30 ENCOUNTER — Ambulatory Visit
Admission: RE | Admit: 2017-03-30 | Discharge: 2017-03-30 | Disposition: A | Discharge status: Home / Self Care | Payer: PRIVATE HEALTH INSURANCE | Source: Ambulatory Visit | Attending: Obstetrics and Gynecology | Admitting: Obstetrics and Gynecology

## 2017-03-30 ENCOUNTER — Other Ambulatory Visit: Payer: Self-pay | Admitting: Obstetrics and Gynecology

## 2017-03-30 DIAGNOSIS — N6320 Unspecified lump in the left breast, unspecified quadrant: Secondary | ICD-10-CM

## 2017-03-30 HISTORY — DX: Unspecified lump in unspecified breast: N63.0

## 2017-04-07 LAB — HM COLONOSCOPY

## 2017-04-08 ENCOUNTER — Other Ambulatory Visit: Payer: Self-pay | Admitting: General Surgery

## 2017-04-08 DIAGNOSIS — C50912 Malignant neoplasm of unspecified site of left female breast: Secondary | ICD-10-CM

## 2017-04-11 ENCOUNTER — Encounter: Payer: Self-pay | Admitting: Radiation Oncology

## 2017-04-11 NOTE — Progress Notes (Signed)
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  Receptor Status: ER(40%), PR (NEG)  Did patient present with symptoms or was this found on screening mammography?: She self palpated the mass and went for evaluation  Past/Anticipated interventions by surgeon, if any: Dr. Donne Hazel saw her 04/08/17 and plans for MRI and Radiation Oncology consult. She has an appointment with Dr. Donne Hazel today for MRI results.   Past/Anticipated interventions by medical oncology, if any: No  Lymphedema issues, if any:  No  Pain issues, if any: She reports pain to her biopsy site at 2/10  SAFETY ISSUES:  Prior radiation? No  Pacemaker/ICD? No  Possible current pregnancy? No       Is the patient on methotrexate? No   Current Complaints / other details:   MRI Breast 04/13/17  BP 117/80   Pulse 68   Temp 98.4 F (36.9 C)   Ht 5\' 6"  (1.676 m)   Wt 143 lb 6.4 oz (65 kg)   LMP 03/18/2017   SpO2 100% Comment: room air  BMI 23.15 kg/m    Wt Readings from Last 3 Encounters:  04/15/17 143 lb 6.4 oz (65 kg)  09/10/16 148 lb (67.1 kg)  08/05/16 145 lb (65.8 kg)      Horton Ellithorpe, Stephani Police, RN 04/11/2017,5:13 PM

## 2017-04-13 ENCOUNTER — Ambulatory Visit
Admission: RE | Admit: 2017-04-13 | Discharge: 2017-04-13 | Disposition: A | Discharge status: Home / Self Care | Payer: PRIVATE HEALTH INSURANCE | Source: Ambulatory Visit | Attending: General Surgery | Admitting: General Surgery

## 2017-04-13 DIAGNOSIS — C50912 Malignant neoplasm of unspecified site of left female breast: Secondary | ICD-10-CM

## 2017-04-13 MED ORDER — GADOBENATE DIMEGLUMINE 529 MG/ML IV SOLN
15.0000 mL | Freq: Once | INTRAVENOUS | Status: AC | PRN
  Administered 2017-04-13: 15 mL via INTRAVENOUS

## 2017-04-15 ENCOUNTER — Ambulatory Visit
Admission: RE | Admit: 2017-04-15 | Discharge: 2017-04-15 | Disposition: A | Discharge status: Home / Self Care | Payer: PRIVATE HEALTH INSURANCE | Source: Ambulatory Visit | Attending: General Surgery | Admitting: General Surgery

## 2017-04-15 ENCOUNTER — Encounter: Payer: Self-pay | Admitting: Radiation Oncology

## 2017-04-15 ENCOUNTER — Ambulatory Visit
Admission: RE | Admit: 2017-04-15 | Discharge: 2017-04-15 | Disposition: A | Discharge status: Home / Self Care | Payer: PRIVATE HEALTH INSURANCE | Source: Ambulatory Visit | Attending: Radiation Oncology | Admitting: Radiation Oncology

## 2017-04-15 ENCOUNTER — Other Ambulatory Visit: Payer: Self-pay | Admitting: General Surgery

## 2017-04-15 VITALS — BP 117/80 | HR 68 | Temp 98.4°F | Ht 66.0 in | Wt 143.4 lb

## 2017-04-15 DIAGNOSIS — Z170 Estrogen receptor positive status [ER+]: Secondary | ICD-10-CM | POA: Insufficient documentation

## 2017-04-15 DIAGNOSIS — Z8249 Family history of ischemic heart disease and other diseases of the circulatory system: Secondary | ICD-10-CM | POA: Insufficient documentation

## 2017-04-15 DIAGNOSIS — Z79899 Other long term (current) drug therapy: Secondary | ICD-10-CM | POA: Diagnosis not present

## 2017-04-15 DIAGNOSIS — Z9889 Other specified postprocedural states: Secondary | ICD-10-CM | POA: Insufficient documentation

## 2017-04-15 DIAGNOSIS — C50412 Malignant neoplasm of upper-outer quadrant of left female breast: Secondary | ICD-10-CM | POA: Diagnosis not present

## 2017-04-15 DIAGNOSIS — N63 Unspecified lump in unspecified breast: Secondary | ICD-10-CM

## 2017-04-15 DIAGNOSIS — Z7952 Long term (current) use of systemic steroids: Secondary | ICD-10-CM | POA: Insufficient documentation

## 2017-04-15 DIAGNOSIS — Z8489 Family history of other specified conditions: Secondary | ICD-10-CM | POA: Diagnosis not present

## 2017-04-15 DIAGNOSIS — Z853 Personal history of malignant neoplasm of breast: Secondary | ICD-10-CM

## 2017-04-15 DIAGNOSIS — Z791 Long term (current) use of non-steroidal anti-inflammatories (NSAID): Secondary | ICD-10-CM | POA: Insufficient documentation

## 2017-04-15 DIAGNOSIS — Z9882 Breast implant status: Secondary | ICD-10-CM | POA: Insufficient documentation

## 2017-04-15 NOTE — Progress Notes (Signed)
Radiation Oncology         [phone] ________________________________  Initial outpatient Consultation  Name: [name] MRN: [id number]  Date: 04/15/2017  DOB: [birth year]  CC:Elby Showers, MD  Rolm Bookbinder, MD   REFERRING PHYSICIAN: Rolm Bookbinder, MD  DIAGNOSIS:    ICD-10-CM   1. Carcinoma of upper-outer quadrant of left breast in female, estrogen receptor positive ([place]) C50.412    Z17.0    Stage 0 [id number] Left Breast UOQ  Ductal Carcinoma In Situ, ER (40%+ weak staining)  / PR (NEG) High Grade   CHIEF COMPLAINT: Here to discuss management of left breast DCIS  HISTORY OF PRESENT ILLNESS::[name] is a lovely 51 y.o. female who presented with a mass to her left breast that her boyfriend palpated and went in for evaluation. Pt had a left breast US completed to her left breast and axilla on 03/30/17 with results revealing: Suspicious mass and calcifications in the left breast.. Patient underwent a diagnostic mammogram completed on 03/30/17 with results revealing: Suspicious mass and calcifications in the left breast. Biopsy on 03/30/17 completed by Dr. Donne Hazel showed breast, left, needle core biopsy, upper outer quadrant with high grade DCIS with calcifications and necrosis involving sclerosing adenosis. Pt was evaluated by Dr. Donne Hazel on 04/08/17 for a follow up with a MRI ordered and completed on 04/13/17 with results revealing: Known left breast cancer. Mass measured 1.3 x 6.6 x 2.3 cm. Possible enhancing lymph node in the lateral right breast - US recommended to evaluate. Pt has a follow up appointment with Dr. Donne Hazel today, 04/15/17.   Pt presents to the office today accompanied by her older sister and boyfriend. Pt voices concern for breast conservation versus a mastectomy. Pt reports that her last mammogram 9 months ago had negative results. She also voices concern for if she should be evaluated by her plastic surgeon for re-construction of her left breast  implant prior to radiation therapy. Pt reports that her breast implants are subpectoral had them for 6 years and were completed by Dr. Rosine Beat. She notes that she has a follow up with her plastic surgeon Dr. Irene Limbo soon. Pt reports that there is no known family hx of breast cancer nor does she have a prior hx of cancer.   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 She notes that she would like to have her plastic surgeon consult and possible surgery in September or early October. Pt reports that she would like to have the surgeries and radiotherapy treatment completed during these times in order to continue traveling and working. She states that she has two children who are in college at this time. Pt states that her husband died 2 years ago suddenly following a sudden MI.   PREVIOUS RADIATION THERAPY: No  PAST MEDICAL HISTORY:  has a past medical history of Breast mass (03/26/2017).    PAST SURGICAL HISTORY: Past Surgical History:  Procedure Laterality Date  . AUGMENTATION MAMMAPLASTY Bilateral 2012   [place]   . BREAST SURGERY     implant Dec 2012    FAMILY HISTORY: family history includes Healthy in her mother; Heart disease in her maternal grandmother; Myelodysplastic syndrome in her father.  SOCIAL HISTORY:  reports that she has never smoked. She has never used smokeless tobacco. She reports that she drinks about 4.2 oz of alcohol per week . She reports that she does not use drugs.  ALLERGIES: Patient  has no known allergies.  MEDICATIONS:  Current Outpatient Prescriptions  Medication Sig Dispense Refill  . ALPRAZolam (XANAX) 0.5 MG tablet One half to one  tab po bid prn anxiety 60 tablet 1  . cyclobenzaprine (FLEXERIL) 10 MG tablet Take 1 tablet (10 mg total) by mouth 3 (three) times daily as needed for muscle spasms. 30 tablet 0  . ketoconazole (NIZORAL) 2 % shampoo APPLY EXTERNALLY 2 TIMES A WEEK 120 mL 11    . meloxicam (MOBIC) 15 MG tablet TAKE 1 TABLET(15 MG) BY MOUTH DAILY 90 tablet 0  . escitalopram (LEXAPRO) 10 MG tablet TAKE 1 TABLET(10 MG) BY MOUTH DAILY (Patient not taking: Reported on 04/15/2017) 30 tablet 2  . nitrofurantoin, macrocrystal-monohydrate, (MACROBID) 100 MG capsule One po bid (Patient not taking: Reported on 04/15/2017) 20 capsule 1  . predniSONE (DELTASONE) 10 MG tablet Taking tapering course as directed _0 (Patient not taking: Reported on 04/15/2017) 21 tablet 0  . valACYclovir (VALTREX) 500 MG tablet Take 1 tablet (500 mg total) by mouth 2 (two) times daily. (Patient not taking: Reported on 04/15/2017) 10 tablet 3  . zolpidem (AMBIEN) 10 MG tablet Take 1 tablet (10 mg total) by mouth at bedtime as needed for sleep. 30 tablet 0   No current facility-administered medications for this encounter.     REVIEW OF SYSTEMS: as above, in her USOH otherwise   PHYSICAL EXAM:  height is _1  (1.676 m) and weight is 143 lb 6.4 oz (65 kg). Her temperature is 98.4 F (36.9 C). Her blood pressure is 117/80 and her pulse is 68. Her oxygen saturation is 100%.   General: Alert and oriented, in no acute distress HEENT: Head is normocephalic. Extraocular movements are intact. Oropharynx is clear. Oral cavity clear.  Neck: Neck is supple, no palpable cervical or supraclavicular lymphadenopathy. Heart: Regular in rate and rhythm with no murmurs, rubs, or gallops. Chest: Clear to auscultation bilaterally, with no rhonchi, wheezes, or rales. Abdomen: Soft, nontender, nondistended, with no rigidity or guarding. Extremities: No cyanosis or edema. Lymphatics: see Neck Exam Skin: No concerning lesions. Musculoskeletal: symmetric strength and muscle tone throughout. Neurologic: Cranial nerves II through XII are grossly intact. No obvious focalities. Speech is fluent. Coordination is intact. Psychiatric: Judgment and insight are intact. Affect is appropriate. Breasts: Left breast: post biopsy  bruising which extend from about 1:30 o'clock all the way through 8 o'clock. Mass that is palpable at the 2:30 position of the breast which is about 4.5 cm in greatest dimension. No other palpable masses appreciated in the breast of left axilla. Right breast with no palpable masses appreciated in the right breast or axilla. Breast implants b/l   ECOG = 0   LABORATORY DATA:  Lab Results  Component Value Date   WBC 4.7 12/08/2015   HGB 14.3 12/08/2015   HCT 42.6 12/08/2015   MCV 91.6 12/08/2015   PLT 242 12/08/2015   CMP     Component Value Date/Time   NA 143 12/08/2015 1122   K 4.3 12/08/2015 1122   CL 106 12/08/2015 1122   CO2 27 12/08/2015 1122   GLUCOSE 83 12/08/2015 1122   BUN 10 12/08/2015 1122   CREATININE 0.76 12/08/2015 1122   CALCIUM 9.4 12/08/2015 1122   PROT 6.5 12/08/2015 1122   ALBUMIN 4.3 12/08/2015 1122   AST 27 12/08/2015 1122   ALT 30 (H) 12/08/2015 1122   ALKPHOS 75 12/08/2015 1122   BILITOT 0.8 12/08/2015 1122  [facility] >89 12/08/2015 1122   GFRAA >89 12/08/2015 1122         RADIOGRAPHY: Mr Breast Bilateral W Wo Contrast  Addendum Date: 04/14/2017   ADDENDUM REPORT: 04/14/2017 13:14 ADDENDUM: If no convincing lymph node could be found in the lateral right breast that would correlate to the MR mass, then the lesion should be excised as surgery with a marker placed on the skin via MR guidance prior to surgery. Electronically Signed   By: Abelardo Diesel M.D.   On: 04/14/2017 13:14   Result Date: 04/14/2017 CLINICAL DATA:  Recent diagnosis left breast cancer LABS:  Does not apply EXAM: BILATERAL BREAST MRI WITH AND WITHOUT CONTRAST TECHNIQUE: Multiplanar, multisequence MR images of both breasts were obtained prior to and following the intravenous administration of 14 ml of MultiHance. THREE-DIMENSIONAL MR IMAGE RENDERING ON INDEPENDENT WORKSTATION: Three-dimensional MR images were rendered by post-processing of the original MR data on an independent  workstation. The three-dimensional MR images were interpreted, and findings are reported in the following complete MRI report for this study. Three dimensional images were evaluated at the independent Dyn[employer] workstation COMPARISON:  Previous exam(s). FINDINGS: Breast composition: c. Heterogeneous fibroglandular tissue. Background parenchymal enhancement: Mild Right breast: In the lateral slightly upper right breast, there is a 5 mm oval focal enhancement abutting the implant. There may be focal notched fat in this mass on the T1 weighted images. This may represent an intramammary lymph node. Breast implant is noted. Left breast: There is irregular enhancement with washout enhancement kinetics in the posterior upper-outer quadrant left breast abutting the implant with enhancement strands extending sports the skin with associated clip consistent with recent diagnosed left breast cancer. This mass measures 1.3 x 6.6 x 2.3 cm. Breast implant is noted. Lymph nodes: No abnormal enlarged lymph nodes are identified. Ancillary findings:  None. IMPRESSION: Known left breast cancer. Possible lymph node in the lateral right breast. RECOMMENDATION: Treatment plan for left breast cancer. Second-look ultrasound for assessment of possible lymph node in the lateral right breast. BI-RADS CATEGORY  6: Known biopsy-proven malignancy. Electronically Signed: By: Abelardo Diesel M.D. On: 04/14/2017 12:49   US Breast Ltd Uni Left Inc Axilla  Result Date: 03/30/2017 CLINICAL DATA:  Patient complains of a palpable abnormality in the upper-outer quadrant of the left breast. EXAM: 2D DIGITAL DIAGNOSTIC BILATERAL MAMMOGRAM WITH IMPLANTS, CAD AND ADJUNCT TOMO ULTRASOUND LEFT BREAST The patient has retropectoral implants. Standard and implant displaced views were performed. COMPARISON:  Previous exam(s). ACR Breast Density Category c: The breast tissue is heterogeneously dense, which may obscure small masses. FINDINGS: No suspicious mass,  malignant type microcalcifications or distortion detected in the right breast. In the upper-outer quadrant of the left breast there has been interval development of a mass with associated pleomorphic coarse heterogeneous calcifications spanning an area of 3.4 cm. On physical exam, I palpate a discrete mass in the left breast at 2 o'clock 5 cm from the nipple. Targeted ultrasound is performed, showing a hypoechoic mass in the left breast at 2 o'clock 5 cm from the nipple measuring 3.0 x 0.9 x 1.3 cm. Echogenic foci are seen within it consistent with calcifications. Sonographic evaluation of the left axilla does not show any enlarged adenopathy. Mammographic images were processed with CAD. IMPRESSION: Suspicious mass and calcifications in the left breast. RECOMMENDATION: Ultrasound-guided core biopsy of the left breast will be performed and dictated separately. I have discussed the findings and recommendations with the patient. Results were also provided in writing at the  conclusion of the visit. If applicable, a reminder letter will be sent to the patient regarding the next appointment. BI-RADS CATEGORY  5: Highly suggestive of malignancy. Electronically Signed   By: Lillia Mountain M.D.   On: 03/30/2017 11:10   US Breast Ltd Uni Right Inc Axilla  Result Date: 04/15/2017 CLINICAL DATA:  51 year old female with newly diagnosed left breast cancer is presenting for second look ultrasound from indeterminate enhancing focus in the right breast on MRI dated 04/13/2017. EXAM: ULTRASOUND OF THE RIGHT BREAST COMPARISON:  Previous exam(s). FINDINGS: On physical exam, I palpate no abnormalities. Extensive ultrasound evaluation of the entire lateral right breast was performed by Dr. Jeanmarie Plant. No focal abnormalities are identified correlating to the abnormality identified on recent MRI. Breast implantation is noted. IMPRESSION: No sonographic findings to correspond with the right breast enhancing focus on recent MRI. This area is  not amenable to MRI guided biopsy due to the adjacent breast implant. RECOMMENDATION: Recommendations stands for excisional biopsy at the time of the patient's surgery. Please see addendum from MRI report dated 04/13/2017. I have discussed the findings and recommendations with the patient. Results were also provided in writing at the conclusion of the visit. If applicable, a reminder letter will be sent to the patient regarding the next appointment. BI-RADS CATEGORY  1: Negative. Electronically Signed   By: Kristopher Oppenheim M.D.   On: 04/15/2017 14:46   Mm Diag Breast W/implant Tomo Bilateral  Result Date: 03/30/2017 CLINICAL DATA:  Patient complains of a palpable abnormality in the upper-outer quadrant of the left breast. EXAM: 2D DIGITAL DIAGNOSTIC BILATERAL MAMMOGRAM WITH IMPLANTS, CAD AND ADJUNCT TOMO ULTRASOUND LEFT BREAST The patient has retropectoral implants. Standard and implant displaced views were performed. COMPARISON:  Previous exam(s). ACR Breast Density Category c: The breast tissue is heterogeneously dense, which may obscure small masses. FINDINGS: No suspicious mass, malignant type microcalcifications or distortion detected in the right breast. In the upper-outer quadrant of the left breast there has been interval development of a mass with associated pleomorphic coarse heterogeneous calcifications spanning an area of 3.4 cm. On physical exam, I palpate a discrete mass in the left breast at 2 o'clock 5 cm from the nipple. Targeted ultrasound is performed, showing a hypoechoic mass in the left breast at 2 o'clock 5 cm from the nipple measuring 3.0 x 0.9 x 1.3 cm. Echogenic foci are seen within it consistent with calcifications. Sonographic evaluation of the left axilla does not show any enlarged adenopathy. Mammographic images were processed with CAD. IMPRESSION: Suspicious mass and calcifications in the left breast. RECOMMENDATION: Ultrasound-guided core biopsy of the left breast will be performed  and dictated separately. I have discussed the findings and recommendations with the patient. Results were also provided in writing at the conclusion of the visit. If applicable, a reminder letter will be sent to the patient regarding the next appointment. BI-RADS CATEGORY  5: Highly suggestive of malignancy. Electronically Signed   By: Lillia Mountain M.D.   On: 03/30/2017 11:10   Mm Clip Placement Left  Result Date: 03/30/2017 CLINICAL DATA:  Status post ultrasound-guided core biopsy of a left breast mass an calcifications. EXAM: DIAGNOSTIC LEFT MAMMOGRAM POST ULTRASOUND BIOPSY COMPARISON:  Previous exam(s). FINDINGS: Mammographic images were obtained following ultrasound guided biopsy of mass and calcifications in the upper-outer quadrant of the left breast. Mammographic images show there is a ribbon shaped clip located at the medial and anterior margins of the added density and calcifications in the left breast. IMPRESSION: Status  post ultrasound-guided core biopsy of the left breast with pathology pending. Final Assessment: Post Procedure Mammograms for Marker Placement Electronically Signed   By: Lillia Mountain M.D.   On: 03/30/2017 11:05   [place] Lt Breast Bx W Loc Dev 1st Lesion Img Bx Spec US Guide  Addendum Date: 04/11/2017   ADDENDUM REPORT: 04/01/2017 11:27 ADDENDUM: Pathology revealed high grade ductal carcinoma in situ with calcifications and necrosis involving sclerosing adenosis in the LEFT breast. This was found to be concordant by Dr. Lillia Mountain. Pathology results were discussed with the patient by telephone. The patient reported doing well after the biopsy. Post biopsy instructions and care were reviewed and questions were answered. The patient was encouraged to call [facility] for any additional concerns. At the request of the patient, a surgical consultation will be scheduled with Dr. Rolm Bookbinder at [facility]. His office will contact the  patient with an appointment. Pathology results reported by Susa Raring RN, BSN on 04/01/2017. Electronically Signed   By: Lillia Mountain M.D.   On: 04/01/2017 11:27   Result Date: 04/11/2017 CLINICAL DATA:  Left breast mass and calcifications. EXAM: ULTRASOUND GUIDED LEFT BREAST CORE NEEDLE BIOPSY COMPARISON:  Previous exam(s). FINDINGS: I met with the patient and we discussed the procedure of ultrasound-guided biopsy, including benefits and alternatives. We discussed the high likelihood of a successful procedure. We discussed the risks of the procedure, including infection, bleeding, tissue injury, clip migration, and inadequate sampling. Informed written consent was given. The usual time-out protocol was performed immediately prior to the procedure. Lesion quadrant: Upper-outer quadrant. Using sterile technique and 1% Lidocaine as local anesthetic, under direct ultrasound visualization, a 14 gauge spring-loaded device was used to perform biopsy of a mass in the 2 o'clock region of the left breast using a lateral to medial approach. At the conclusion of the procedure a ribbon shaped tissue marker clip was deployed into the biopsy cavity. Follow up 2 view mammogram was performed and dictated separately. IMPRESSION: Ultrasound guided biopsy of the left breast. No apparent complications. Electronically Signed: By: Lillia Mountain M.D. On: 03/30/2017 10:48      IMPRESSION/PLAN: [name] is a 51 y.o. lovely woman with left breast DCIS related to a mass; high grade, with necrosis, weakly ER+ at 40%  It was a pleasure meeting the patient today.  She will see Dr. Donne Hazel to review her MRI and feasibility of breast conservation. She understands [facility] biospy will likely be pursued.    In the setting of breast conservation, we discussed the risks, benefits, and side effects of radiotherapy. I recommend radiotherapy to the left breast to reduce her risk of locoregional recurrence by at least 1/2.  We discussed that  radiation would take approximately 6 weeks to complete and that I would give the patient a few weeks to heal following surgery before starting treatment planning. We spoke about acute effects including skin irritation and fatigue and capsular contracture subacutely as well as much less common late effects including internal organ injury or irritation. We spoke about the latest technology that is used to minimize the risk of late effects for patients undergoing radiotherapy to the breast or chest wall. No guarantees of treatment were given.  A consent form was signed and placed in the patient's medical record. The patient is enthusiastic about proceeding with treatment. I look forward to participating in the patient's care.  I will await her referral back to me for postoperative follow-up  and eventual CT simulation/treatment planning if warranted.  I will defer the discussion on breast conservation feasibility to Dr. Donne Hazel. If Dr. Irene Limbo suggests that the patient undergo any further plastic surgery, that feedback will be helpful, and the timing of plastic surgery could vary.  Radiotherapy will be scheduled to start 4-8 weeks from surgery.   I will refer the patient to a social worker to aid with coping mechanisms - she is feeling anxious and stressed, and is interested in this. May want to be paired with a therapist. Emotional support given today.  I spent 55 minutes  face to face with the patient and more than 50% of that time was spent in counseling and/or coordination of care.   __________________________________________   Eppie Gibson, MD   This document serves as a record of services personally performed by Eppie Gibson, MD. It was created on her behalf by Steva Colder, a trained medical scribe. The creation of this record is based on the scribe's personal observations and the provider's statements to them. This document has been checked and approved by the attending provider.

## 2017-04-16 DIAGNOSIS — C801 Malignant (primary) neoplasm, unspecified: Secondary | ICD-10-CM

## 2017-04-16 HISTORY — DX: Malignant (primary) neoplasm, unspecified: C80.1

## 2017-04-20 ENCOUNTER — Other Ambulatory Visit: Payer: Self-pay | Admitting: General Surgery

## 2017-04-20 DIAGNOSIS — R928 Other abnormal and inconclusive findings on diagnostic imaging of breast: Secondary | ICD-10-CM

## 2017-04-25 ENCOUNTER — Ambulatory Visit
Admission: RE | Admit: 2017-04-25 | Discharge: 2017-04-25 | Disposition: A | Discharge status: Home / Self Care | Payer: PRIVATE HEALTH INSURANCE | Source: Ambulatory Visit | Attending: General Surgery | Admitting: General Surgery

## 2017-04-25 ENCOUNTER — Other Ambulatory Visit: Payer: Self-pay | Admitting: General Surgery

## 2017-04-25 ENCOUNTER — Encounter: Payer: Self-pay | Admitting: Hematology and Oncology

## 2017-04-25 ENCOUNTER — Telehealth: Payer: Self-pay | Admitting: Hematology and Oncology

## 2017-04-25 DIAGNOSIS — N6320 Unspecified lump in the left breast, unspecified quadrant: Secondary | ICD-10-CM

## 2017-04-25 DIAGNOSIS — Z853 Personal history of malignant neoplasm of breast: Secondary | ICD-10-CM

## 2017-04-25 DIAGNOSIS — R928 Other abnormal and inconclusive findings on diagnostic imaging of breast: Secondary | ICD-10-CM

## 2017-04-25 DIAGNOSIS — N63 Unspecified lump in unspecified breast: Secondary | ICD-10-CM

## 2017-04-25 MED ORDER — GADOBENATE DIMEGLUMINE 529 MG/ML IV SOLN
14.0000 mL | Freq: Once | INTRAVENOUS | Status: AC | PRN
  Administered 2017-04-25: 14 mL via INTRAVENOUS

## 2017-04-25 NOTE — Telephone Encounter (Signed)
Appt has been scheduled for the pt to see Dr. Lindi Adie on 9/17 at 215pm. Letter mailed with appt information.

## 2017-04-26 ENCOUNTER — Other Ambulatory Visit: Payer: Self-pay | Admitting: General Surgery

## 2017-04-26 DIAGNOSIS — D0512 Intraductal carcinoma in situ of left breast: Secondary | ICD-10-CM

## 2017-04-27 ENCOUNTER — Other Ambulatory Visit: Payer: Self-pay | Admitting: Internal Medicine

## 2017-04-29 ENCOUNTER — Telehealth: Payer: Self-pay | Admitting: Hematology and Oncology

## 2017-04-29 NOTE — Telephone Encounter (Signed)
Patient called to cancel her appointment and said that she needed to talk with family and that she would call back to reschedule.  Is will also be out of town and does not get back until Monday afternoon

## 2017-05-02 ENCOUNTER — Ambulatory Visit: Payer: PRIVATE HEALTH INSURANCE | Admitting: Hematology and Oncology

## 2017-05-17 ENCOUNTER — Encounter (HOSPITAL_BASED_OUTPATIENT_CLINIC_OR_DEPARTMENT_OTHER): Payer: Self-pay | Admitting: *Deleted

## 2017-05-18 NOTE — Progress Notes (Signed)
Pt in for Ensure Pre op drink, instructions reviewed.

## 2017-05-25 ENCOUNTER — Ambulatory Visit
Admission: RE | Admit: 2017-05-25 | Discharge: 2017-05-25 | Disposition: A | Discharge status: Home / Self Care | Payer: PRIVATE HEALTH INSURANCE | Source: Ambulatory Visit | Attending: General Surgery | Admitting: General Surgery

## 2017-05-25 ENCOUNTER — Other Ambulatory Visit (HOSPITAL_COMMUNITY): Payer: PRIVATE HEALTH INSURANCE

## 2017-05-25 DIAGNOSIS — D0512 Intraductal carcinoma in situ of left breast: Secondary | ICD-10-CM

## 2017-05-26 ENCOUNTER — Ambulatory Visit (HOSPITAL_BASED_OUTPATIENT_CLINIC_OR_DEPARTMENT_OTHER): Payer: PRIVATE HEALTH INSURANCE | Admitting: Certified Registered"

## 2017-05-26 ENCOUNTER — Encounter (HOSPITAL_BASED_OUTPATIENT_CLINIC_OR_DEPARTMENT_OTHER): Payer: Self-pay | Admitting: Certified Registered"

## 2017-05-26 ENCOUNTER — Ambulatory Visit (HOSPITAL_COMMUNITY)
Admission: RE | Admit: 2017-05-26 | Discharge: 2017-05-26 | Disposition: A | Discharge status: Home / Self Care | Payer: PRIVATE HEALTH INSURANCE | Source: Ambulatory Visit | Attending: General Surgery | Admitting: General Surgery

## 2017-05-26 ENCOUNTER — Ambulatory Visit
Admission: RE | Admit: 2017-05-26 | Discharge: 2017-05-26 | Disposition: A | Discharge status: Home / Self Care | Payer: PRIVATE HEALTH INSURANCE | Source: Ambulatory Visit | Attending: General Surgery | Admitting: General Surgery

## 2017-05-26 ENCOUNTER — Ambulatory Visit (HOSPITAL_BASED_OUTPATIENT_CLINIC_OR_DEPARTMENT_OTHER)
Admission: RE | Admit: 2017-05-26 | Discharge: 2017-05-26 | Disposition: A | Discharge status: Home / Self Care | Payer: PRIVATE HEALTH INSURANCE | Source: Ambulatory Visit | Attending: General Surgery | Admitting: General Surgery

## 2017-05-26 ENCOUNTER — Encounter (HOSPITAL_BASED_OUTPATIENT_CLINIC_OR_DEPARTMENT_OTHER)
Admission: RE | Disposition: A | Discharge status: Home / Self Care | Payer: Self-pay | Source: Ambulatory Visit | Attending: General Surgery

## 2017-05-26 DIAGNOSIS — D0512 Intraductal carcinoma in situ of left breast: Secondary | ICD-10-CM

## 2017-05-26 DIAGNOSIS — Z79899 Other long term (current) drug therapy: Secondary | ICD-10-CM | POA: Insufficient documentation

## 2017-05-26 DIAGNOSIS — Z9882 Breast implant status: Secondary | ICD-10-CM | POA: Insufficient documentation

## 2017-05-26 DIAGNOSIS — F419 Anxiety disorder, unspecified: Secondary | ICD-10-CM | POA: Diagnosis not present

## 2017-05-26 HISTORY — PX: BREAST LUMPECTOMY WITH RADIOACTIVE SEED AND SENTINEL LYMPH NODE BIOPSY: SHX6550

## 2017-05-26 HISTORY — DX: Anxiety disorder, unspecified: F41.9

## 2017-05-26 HISTORY — PX: BREAST IMPLANT REMOVAL: SHX5361

## 2017-05-26 HISTORY — DX: Malignant (primary) neoplasm, unspecified: C80.1

## 2017-05-26 SURGERY — BREAST LUMPECTOMY WITH RADIOACTIVE SEED AND SENTINEL LYMPH NODE BIOPSY
Anesthesia: General | Site: Breast | Laterality: Left

## 2017-05-26 MED ORDER — OXYCODONE HCL 5 MG PO TABS
ORAL_TABLET | ORAL | Status: AC
  Filled 2017-05-26: qty 1

## 2017-05-26 MED ORDER — TECHNETIUM TC 99M SULFUR COLLOID FILTERED
1.0000 | Freq: Once | INTRAVENOUS | Status: AC | PRN
  Administered 2017-05-26: 1 via INTRADERMAL

## 2017-05-26 MED ORDER — CEFAZOLIN SODIUM-DEXTROSE 2-4 GM/100ML-% IV SOLN
INTRAVENOUS | Status: AC
  Filled 2017-05-26: qty 100

## 2017-05-26 MED ORDER — HYDROMORPHONE HCL 1 MG/ML IJ SOLN
INTRAMUSCULAR | Status: AC
  Filled 2017-05-26: qty 0.5

## 2017-05-26 MED ORDER — FENTANYL CITRATE (PF) 100 MCG/2ML IJ SOLN
INTRAMUSCULAR | Status: AC
  Filled 2017-05-26: qty 2

## 2017-05-26 MED ORDER — CELECOXIB 200 MG PO CAPS
ORAL_CAPSULE | ORAL | Status: AC
  Filled 2017-05-26: qty 1

## 2017-05-26 MED ORDER — BUPIVACAINE HCL (PF) 0.25 % IJ SOLN
INTRAMUSCULAR | Status: DC | PRN
  Administered 2017-05-26: 1 mL

## 2017-05-26 MED ORDER — ROPIVACAINE HCL 5 MG/ML IJ SOLN
INTRAMUSCULAR | Status: DC | PRN
  Administered 2017-05-26: 30 mL via PERINEURAL

## 2017-05-26 MED ORDER — SCOPOLAMINE 1 MG/3DAYS TD PT72: 1.0000 | MEDICATED_PATCH | Freq: Once | TRANSDERMAL | Status: DC | PRN

## 2017-05-26 MED ORDER — SODIUM CHLORIDE 0.9 % IJ SOLN
INTRAMUSCULAR | Status: AC
  Filled 2017-05-26: qty 20

## 2017-05-26 MED ORDER — ACETAMINOPHEN 500 MG PO TABS
1000.0000 mg | ORAL_TABLET | ORAL | Status: AC
  Administered 2017-05-26: 1000 mg via ORAL

## 2017-05-26 MED ORDER — OXYCODONE HCL 5 MG/5ML PO SOLN: 5.0000 mg | Freq: Once | ORAL | Status: AC | PRN

## 2017-05-26 MED ORDER — OXYCODONE HCL 5 MG PO TABS: 5.0000 mg | ORAL_TABLET | Freq: Four times a day (QID) | ORAL | 0 refills | Status: DC | PRN

## 2017-05-26 MED ORDER — MEPERIDINE HCL 25 MG/ML IJ SOLN: 6.2500 mg | INTRAMUSCULAR | Status: DC | PRN

## 2017-05-26 MED ORDER — MIDAZOLAM HCL 2 MG/2ML IJ SOLN
1.0000 mg | INTRAMUSCULAR | Status: DC | PRN
  Administered 2017-05-26: 2 mg via INTRAVENOUS

## 2017-05-26 MED ORDER — CEFAZOLIN SODIUM-DEXTROSE 2-4 GM/100ML-% IV SOLN
2.0000 g | INTRAVENOUS | Status: AC
  Administered 2017-05-26: 2 g via INTRAVENOUS

## 2017-05-26 MED ORDER — GABAPENTIN 300 MG PO CAPS
300.0000 mg | ORAL_CAPSULE | ORAL | Status: AC
  Administered 2017-05-26: 300 mg via ORAL

## 2017-05-26 MED ORDER — FENTANYL CITRATE (PF) 100 MCG/2ML IJ SOLN
50.0000 ug | INTRAMUSCULAR | Status: AC | PRN
  Administered 2017-05-26: 100 ug via INTRAVENOUS
  Administered 2017-05-26 (×2): 50 ug via INTRAVENOUS

## 2017-05-26 MED ORDER — BUPIVACAINE HCL (PF) 0.25 % IJ SOLN
INTRAMUSCULAR | Status: AC
  Filled 2017-05-26: qty 90

## 2017-05-26 MED ORDER — CELECOXIB 200 MG PO CAPS
200.0000 mg | ORAL_CAPSULE | ORAL | Status: AC
  Administered 2017-05-26: 200 mg via ORAL

## 2017-05-26 MED ORDER — GABAPENTIN 300 MG PO CAPS
ORAL_CAPSULE | ORAL | Status: AC
  Filled 2017-05-26: qty 1

## 2017-05-26 MED ORDER — 0.9 % SODIUM CHLORIDE (POUR BTL) OPTIME
TOPICAL | Status: DC | PRN
  Administered 2017-05-26: 1000 mL

## 2017-05-26 MED ORDER — OXYCODONE HCL 5 MG PO TABS
5.0000 mg | ORAL_TABLET | Freq: Once | ORAL | Status: AC | PRN
  Administered 2017-05-26: 5 mg via ORAL

## 2017-05-26 MED ORDER — ACETAMINOPHEN 500 MG PO TABS
ORAL_TABLET | ORAL | Status: AC
  Filled 2017-05-26: qty 2

## 2017-05-26 MED ORDER — DEXAMETHASONE SODIUM PHOSPHATE 4 MG/ML IJ SOLN
INTRAMUSCULAR | Status: DC | PRN
  Administered 2017-05-26: 10 mg via INTRAVENOUS

## 2017-05-26 MED ORDER — HYDROMORPHONE HCL 1 MG/ML IJ SOLN
0.2500 mg | INTRAMUSCULAR | Status: DC | PRN
  Administered 2017-05-26 (×2): 0.25 mg via INTRAVENOUS

## 2017-05-26 MED ORDER — PROMETHAZINE HCL 25 MG/ML IJ SOLN: 6.2500 mg | INTRAMUSCULAR | Status: DC | PRN

## 2017-05-26 MED ORDER — LACTATED RINGERS IV SOLN
INTRAVENOUS | Status: DC
  Administered 2017-05-26 (×2): via INTRAVENOUS

## 2017-05-26 MED ORDER — PROPOFOL 10 MG/ML IV BOLUS
INTRAVENOUS | Status: DC | PRN
  Administered 2017-05-26: 150 mg via INTRAVENOUS

## 2017-05-26 MED ORDER — METHYLENE BLUE 0.5 % INJ SOLN
INTRAVENOUS | Status: AC
  Filled 2017-05-26: qty 20

## 2017-05-26 MED ORDER — LIDOCAINE HCL (CARDIAC) 20 MG/ML IV SOLN
INTRAVENOUS | Status: DC | PRN
  Administered 2017-05-26: 30 mg via INTRAVENOUS

## 2017-05-26 MED ORDER — MIDAZOLAM HCL 2 MG/2ML IJ SOLN
INTRAMUSCULAR | Status: AC
  Filled 2017-05-26: qty 2

## 2017-05-26 SURGICAL SUPPLY — 52 items
ADH SKN CLS APL DERMABOND .7 (GAUZE/BANDAGES/DRESSINGS) ×1
APPLIER CLIP 9.375 MED OPEN (MISCELLANEOUS) ×3
APR CLP MED 9.3 20 MLT OPN (MISCELLANEOUS) ×1
BINDER BREAST MEDIUM (GAUZE/BANDAGES/DRESSINGS) ×3 IMPLANT
BLADE SURG 15 STRL LF DISP TIS (BLADE) ×1 IMPLANT
BLADE SURG 15 STRL SS (BLADE) ×3
CANISTER SUCT 1200ML W/VALVE (MISCELLANEOUS) ×3 IMPLANT
CHLORAPREP W/TINT 26ML (MISCELLANEOUS) ×3 IMPLANT
CLIP APPLIE 9.375 MED OPEN (MISCELLANEOUS) ×1 IMPLANT
CLIP VESOCCLUDE SM WIDE 6/CT (CLIP) ×3 IMPLANT
CLOSURE WOUND 1/2 X4 (GAUZE/BANDAGES/DRESSINGS) ×1
COVER BACK TABLE 60X90IN (DRAPES) ×3 IMPLANT
COVER MAYO STAND STRL (DRAPES) ×3 IMPLANT
COVER PROBE W GEL 5X96 (DRAPES) ×3 IMPLANT
DERMABOND ADVANCED (GAUZE/BANDAGES/DRESSINGS) ×2
DERMABOND ADVANCED .7 DNX12 (GAUZE/BANDAGES/DRESSINGS) ×1 IMPLANT
DEVICE DUBIN W/COMP PLATE 8390 (MISCELLANEOUS) ×3 IMPLANT
DRAPE LAPAROSCOPIC ABDOMINAL (DRAPES) ×3 IMPLANT
DRAPE UTILITY XL STRL (DRAPES) ×3 IMPLANT
ELECT COATED BLADE 2.86 ST (ELECTRODE) ×3 IMPLANT
ELECT REM PT RETURN 9FT ADLT (ELECTROSURGICAL) ×3
ELECTRODE REM PT RTRN 9FT ADLT (ELECTROSURGICAL) ×1 IMPLANT
GLOVE BIO SURGEON STRL SZ7 (GLOVE) ×6 IMPLANT
GLOVE BIOGEL PI IND STRL 7.5 (GLOVE) ×1 IMPLANT
GLOVE BIOGEL PI INDICATOR 7.5 (GLOVE) ×2
GOWN STRL REUS W/ TWL LRG LVL3 (GOWN DISPOSABLE) ×2 IMPLANT
GOWN STRL REUS W/TWL LRG LVL3 (GOWN DISPOSABLE) ×6
HEMOSTAT ARISTA ABSORB 3G PWDR (MISCELLANEOUS) ×3 IMPLANT
ILLUMINATOR WAVEGUIDE N/F (MISCELLANEOUS) ×3 IMPLANT
KIT MARKER MARGIN INK (KITS) ×3 IMPLANT
NDL SAFETY ECLIPSE 18X1.5 (NEEDLE) IMPLANT
NEEDLE HYPO 18GX1.5 SHARP (NEEDLE)
NEEDLE HYPO 25X1 1.5 SAFETY (NEEDLE) ×3 IMPLANT
NS IRRIG 1000ML POUR BTL (IV SOLUTION) IMPLANT
PACK BASIN DAY SURGERY FS (CUSTOM PROCEDURE TRAY) ×3 IMPLANT
PENCIL BUTTON HOLSTER BLD 10FT (ELECTRODE) ×3 IMPLANT
SLEEVE SCD COMPRESS KNEE MED (MISCELLANEOUS) ×3 IMPLANT
SPONGE LAP 4X18 X RAY DECT (DISPOSABLE) ×3 IMPLANT
STRIP CLOSURE SKIN 1/2X4 (GAUZE/BANDAGES/DRESSINGS) ×2 IMPLANT
SUT MNCRL AB 4-0 PS2 18 (SUTURE) ×3 IMPLANT
SUT SILK 2 0 SH (SUTURE) ×3 IMPLANT
SUT VIC AB 2-0 SH 27 (SUTURE) ×9
SUT VIC AB 2-0 SH 27XBRD (SUTURE) ×3 IMPLANT
SUT VIC AB 3-0 SH 27 (SUTURE) ×3
SUT VIC AB 3-0 SH 27X BRD (SUTURE) ×1 IMPLANT
SUT VIC AB 5-0 PS2 18 (SUTURE) IMPLANT
SYR CONTROL 10ML LL (SYRINGE) ×3 IMPLANT
TOWEL OR 17X24 6PK STRL BLUE (TOWEL DISPOSABLE) ×3 IMPLANT
TOWEL OR NON WOVEN STRL DISP B (DISPOSABLE) ×3 IMPLANT
TUBE CONNECTING 20'X1/4 (TUBING) ×1
TUBE CONNECTING 20X1/4 (TUBING) ×2 IMPLANT
YANKAUER SUCT BULB TIP NO VENT (SUCTIONS) ×3 IMPLANT

## 2017-05-26 NOTE — Discharge Instructions (Addendum)
[facility] °Office Phone Number [phone] ° °POST OP INSTRUCTIONS ° °Always review your discharge instruction sheet given to you by the facility where your surgery was performed. ° °IF YOU HAVE DISABILITY OR FAMILY LEAVE FORMS, YOU MUST BRING THEM TO THE OFFICE FOR PROCESSING.  DO NOT GIVE THEM TO YOUR DOCTOR. ° °1. A prescription for pain medication may be given to you upon discharge.  Take your pain medication as prescribed, if needed.  If narcotic pain medicine is not needed, then you may take acetaminophen (Tylenol), naprosyn (Alleve) or ibuprofen (Advil) as needed. °2. Take your usually prescribed medications unless otherwise directed °3. If you need a refill on your pain medication, please contact your pharmacy.  They will contact our office to request authorization.  Prescriptions will not be filled after 5pm or on week-ends. °4. You should eat very light the first 24 hours after surgery, such as soup, crackers, pudding, etc.  Resume your normal diet the day after surgery. °5. Most patients will experience some swelling and bruising in the breast.  Ice packs and a good support bra will help.  Wear the breast binder provided or a sports bra for 72 hours day and night.  After that wear a sports bra during the day until you return to the office. Swelling and bruising can take several days to resolve.  °6. It is common to experience some constipation if taking pain medication after surgery.  Increasing fluid intake and taking a stool softener will usually help or prevent this problem from occurring.  A mild laxative (Milk of Magnesia or Miralax) should be taken according to package directions if there are no bowel movements after 48 hours. °7. Unless discharge instructions indicate otherwise, you may remove your bandages 48 hours after surgery and you may shower at that time.  You may have steri-strips (small skin tapes) in place directly over the incision.  These strips should be left on the  skin for 7-10 days and will come off on their own.  If your surgeon used skin glue on the incision, you may shower in 24 hours.  The glue will flake off over the next 2-3 weeks.  Any sutures or staples will be removed at the office during your follow-up visit. °8. ACTIVITIES:  You may resume regular daily activities (gradually increasing) beginning the next day.  Wearing a good support bra or sports bra minimizes pain and swelling.  You may have sexual intercourse when it is comfortable. °a. You may drive when you no longer are taking prescription pain medication, you can comfortably wear a seatbelt, and you can safely maneuver your car and apply brakes. °b. RETURN TO WORK:  ______________________________________________________________________________________ °9. You should see your doctor in the office for a follow-up appointment approximately two weeks after your surgery.  Your doctor’s nurse will typically make your follow-up appointment when she calls you with your pathology report.  Expect your pathology report 3-4 business days after your surgery.  You may call to check if you do not hear from us after three days. °10. OTHER INSTRUCTIONS: _______________________________________________________________________________________________ _____________________________________________________________________________________________________________________________________ °_____________________________________________________________________________________________________________________________________ °_____________________________________________________________________________________________________________________________________ ° °WHEN TO CALL DR WAKEFIELD: °1. Fever over 101.0 °2. Nausea and/or vomiting. °3. Extreme swelling or bruising. °4. Continued bleeding from incision. °5. Increased pain, redness, or drainage from the incision. ° °The clinic staff is available to answer your questions during regular  business hours.  Please don’t hesitate to call and ask to speak to one of the nurses for clinical concerns.  If   you have a medical emergency, go to the nearest emergency room or call 911.  A surgeon from [facility] is always on call at the hospital. ° °For further questions, please visit [email or web address] ° ° ° ° ° °Post Anesthesia Home Care Instructions ° °Activity: °Get plenty of rest for the remainder of the day. A responsible individual must stay with you for 24 hours following the procedure.  °For the next 24 hours, DO NOT: °-Drive a car °-Operate machinery °-Drink alcoholic beverages °-Take any medication unless instructed by your physician °-Make any legal decisions or sign important papers. ° °Meals: °Start with liquid foods such as gelatin or soup. Progress to regular foods as tolerated. Avoid greasy, spicy, heavy foods. If nausea and/or vomiting occur, drink only clear liquids until the nausea and/or vomiting subsides. Call your physician if vomiting continues. ° °Special Instructions/Symptoms: °Your throat may feel dry or sore from the anesthesia or the breathing tube placed in your throat during surgery. If this causes discomfort, gargle with warm salt water. The discomfort should disappear within 24 hours. ° °If you had a scopolamine patch placed behind your ear for the management of post- operative nausea and/or vomiting: ° °1. The medication in the patch is effective for 72 hours, after which it should be removed.  Wrap patch in a tissue and discard in the trash. Wash hands thoroughly with soap and water. °2. You may remove the patch earlier than 72 hours if you experience unpleasant side effects which may include dry mouth, dizziness or visual disturbances. °3. Avoid touching the patch. Wash your hands with soap and water after contact with the patch. °  ° °

## 2017-05-26 NOTE — Anesthesia Postprocedure Evaluation (Signed)
Anesthesia Post Note  Patient: SHATERRICA TERRITO  Procedure(s) Performed: LEFT BREAST LUMPECTOMY WITH BRACKETED RADIOACTIVE SEED AND SENTINEL LYMPH NODE BIOPSY (Left Breast) REMOVAL BREAST IMPLANT (Left Breast)     Patient location during evaluation: PACU Anesthesia Type: General Level of consciousness: awake and alert Pain management: pain level controlled Vital Signs Assessment: post-procedure vital signs reviewed and stable Respiratory status: spontaneous breathing, nonlabored ventilation and respiratory function stable Cardiovascular status: blood pressure returned to baseline and stable Postop Assessment: no apparent nausea or vomiting Anesthetic complications: no    Last Vitals:  Vitals:   05/26/17 1206 05/26/17 1215  BP: 131/85 132/80  Pulse:  80  Resp:    Temp:  36.6 C  SpO2:  99%    Last Pain:  Vitals:   05/26/17 1215  TempSrc:   PainSc: 2                  Lynda Rainwater

## 2017-05-26 NOTE — H&P (Signed)
[name] is an 51 y.o. female.   Chief Complaint: breast cancer HPI:  1 yof here for return visit. her er/pr came back at 40% and negative. she has seen rad onc. she has also underwent an mri. this shows on the right in lateral right breast a 5 mm oval focal enhancement abutting the implant. on the left the area where biopsy was done measures 1.3x6.6x2.3 cm in size. there are no abnormal nodes. she has undergone attempt at right sided [place] and this does not show area on mri. she then underwent attempt at mr biopsy that the right sided area is less prominent and now states consider excision. I have discussed with Dr Rosana Hoes who states core biopsy not able to be done and he would be fine with six month follow up mri on this side. the area on left is 3.5x1x2.8 cm and I think some of extra enhancement was hematoma. she also has implant getting smaller and appears to be deflated.  Past Medical History:  Diagnosis Date  . Anxiety   . Breast mass 03/26/2017   3 oclock x 1 week   . Cancer ([place]) 04/2017   left breast cancer    Past Surgical History:  Procedure Laterality Date  . AUGMENTATION MAMMAPLASTY Bilateral 2012   Saline   . BREAST SURGERY     implant Dec 2012    Family History  Problem Relation Age of Onset  . Myelodysplastic syndrome Father   . Heart disease Maternal Grandmother   . Healthy Mother   . Breast cancer Neg Hx    Social History:  reports that she has never smoked. She has never used smokeless tobacco. She reports that she drinks about 4.2 oz of alcohol per week . She reports that she does not use drugs.  Allergies: Not on File  Medications Prior to Admission  Medication Sig Dispense Refill  . cyclobenzaprine (FLEXERIL) 10 MG tablet Take 1 tablet (10 mg total) by mouth 3 (three) times daily as needed for muscle spasms. 30 tablet 0  . meloxicam (MOBIC) 15 MG tablet TAKE 1 TABLET BY MOUTH ONCE DAILY 90 tablet 0  . nitrofurantoin, macrocrystal-monohydrate,  (MACROBID) 100 MG capsule One po bid 20 capsule 1  . valACYclovir (VALTREX) 500 MG tablet Take 1 tablet (500 mg total) by mouth 2 (two) times daily. (Patient taking differently: Take 500 mg by mouth 2 (two) times daily. ) 10 tablet 3    No results found for this or any previous visit (from the past 48 hour(s)). 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 The patient states today that her left breast implant has collapsed, and she plans to have it removed at the time of surgery this week. It is requested that the biopsy proven malignancy in the upper-outer quadrant of the left breast bracketed with 2 seeds. I discussed the case with Dr. Donne Hazel today, and he is aware that due to the far posterior positioning of the posterior calcifications that I will be unable to put the seed at the posterior most extent of the calcifications, but it will be placed as far posteriorly as possible with mammographic guidance. This report describes the seed localization of the anterior aspect of the calcifications. EXAM:  MAMMOGRAPHIC GUIDED RADIOACTIVE SEED LOCALIZATION OF THE LEFT BREAST COMPARISON:  Previous exam(s). FINDINGS: Patient presents for radioactive seed localization with bracketing prior to lumpectomy. I met with the patient and we discussed the procedure of seed localization including benefits and alternatives. We discussed the high likelihood of a successful procedure. We discussed the risks of the procedure including infection, bleeding, tissue injury, possible puncture of the breast implant, and further surgery. We discussed the low dose of radioactivity involved in the procedure. Informed, written  consent was given. The usual time-out protocol was performed immediately prior to the procedure. Using mammographic guidance, sterile technique, 1% lidocaine and an I-125 radioactive seed, the ribbon shaped biopsy clip and anterior extent of the calcifications in the upper outer left breast were localized using a superior to inferior approach. The follow-up mammogram images confirm the seed in the expected location and were marked for Dr. Wakefield. Follow-up survey of the patient confirms presence of the radioactive seed. Order number of I-125 seed:  [id number]. Total activity:  0.246 mCi  Reference Date: 23 May 2017 The patient tolerated the procedure well and was released from the [facility]. She was given instructions regarding seed removal. IMPRESSION: Radioactive seed localization left breast. No apparent complications. Electronically Signed   By: Susan  Turner M.D.   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 The patient states today that her left breast implant has collapsed, and she plans to have it removed at the time of surgery this week. It is requested that the biopsy proven malignancy in the upper-outer quadrant of the left breast bracketed with 2 seeds. I discussed the case with Dr. Wakefield today, and he is aware that due to the far posterior positioning of the posterior calcifications that I will be unable to put the seed at the posterior most extent of the calcifications, but it will be placed as far posteriorly as possible with mammographic guidance. This report describes the seed placed along the posterior extent of the calcifications. Different patient  positions were attempted prior to final placement of this seed. EXAM: MAMMOGRAPHIC GUIDED RADIOACTIVE SEED LOCALIZATION OF THE LEFT BREAST COMPARISON:  Previous exam(s). FINDINGS: Patient presents for radioactive seed localization prior to lumpectomy. I met with the patient and we discussed the procedure of seed localization including benefits and alternatives. We discussed the high likelihood of a successful procedure. We discussed the risks of the procedure including infection, bleeding, tissue injury and further surgery. We discussed the low dose of radioactivity involved in the procedure. Informed, written consent was given. The usual time-out protocol was performed immediately prior to the procedure. Using mammographic guidance, sterile technique, 1% lidocaine and an I-125 radioactive seed, the more posterior aspect of the calcifications, but not the extreme posterior aspect was localized using a lateral to medial approach. The follow-up mammogram images confirm the seed in the expected location and were marked for Dr. Wakefield. Follow-up survey of the patient confirms presence of the radioactive seed. Order number of I-125 seed:  [id number]. Total activity:  0.250 mCi  Reference Date: 20 May 2017 The patient tolerated the procedure well and was released from the [facility]. She was given instructions regarding seed removal. IMPRESSION: Radioactive   seed localization left breast. No apparent complications. Electronically Signed   By: Curlene Dolphin M.D.   On: 05/25/2017 17:02    ROS General Not Present- Appetite Loss, Chills, Fatigue, Fever, Night Sweats, Weight Gain and Weight Loss. Skin Not Present- Change in Wart/Mole, Dryness, Hives, Jaundice, New Lesions, Non-Healing Wounds, Rash and Ulcer. HEENT Not Present- Earache, Hearing Loss, Hoarseness, Nose Bleed, Oral Ulcers, Ringing in the Ears, Seasonal Allergies, Sinus Pain, Sore Throat, Visual Disturbances, Wears glasses/contact lenses and Yellow  Eyes. Respiratory Not Present- Bloody sputum, Chronic Cough, Difficulty Breathing, Snoring and Wheezing. Breast Present- Breast Mass. Not Present- Breast Pain, Nipple Discharge and Skin Changes. Cardiovascular Not Present- Chest Pain, Difficulty Breathing Lying Down, Leg Cramps, Palpitations, Rapid Heart Rate, Shortness of Breath and Swelling of Extremities. Gastrointestinal Not Present- Abdominal Pain, Bloating, Bloody Stool, Change in Bowel Habits, Chronic diarrhea, Constipation, Difficulty Swallowing, Excessive gas, Gets full quickly at meals, Hemorrhoids, Indigestion, Nausea, Rectal Pain and Vomiting. Female Genitourinary Not Present- Frequency, Nocturia, Painful Urination, Pelvic Pain and Urgency. Musculoskeletal Not Present- Back Pain, Joint Pain, Joint Stiffness, Muscle Pain, Muscle Weakness and Swelling of Extremities. Neurological Not Present- Decreased Memory, Fainting, Headaches, Numbness, Seizures, Tingling, Tremor, Trouble walking and Weakness. Psychiatric Not Present- Anxiety, Bipolar, Change in Sleep Pattern, Depression, Fearful and Frequent crying. Hematology Not Present- Blood Thinners, Easy Bruising, Excessive bleeding, Gland problems, HIV and Persistent Infections.  Blood pressure 111/75, pulse 69, temperature 97.9 F (36.6 C), temperature source Oral, resp. rate 20, height 5' 6" (1.676 m), weight 64.4 kg (142 lb), last menstrual period 05/12/2017, SpO2 100 %. Physical Exam  Vitals  Weight: 143.6 lb Height: 66in Body Surface Area: 1.74 m Body Mass Index: 23.18 kg/m  Pulse: 72 (Regular)  BP: 122/78 (Sitting, Left Arm, Standard) Physical Exam General Mental Status-Alert. Orientation-Oriented X3. Head and Neck Trachea-midline. Thyroid Gland Characteristics - normal size and consistency. Eye Sclera/Conjunctiva - Bilateral-No scleral icterus. Chest and Lung Exam Chest and lung exam reveals -quiet, even and easy respiratory effort with no use of  accessory muscles and on auscultation, normal breath sounds, no adventitious sounds and normal vocal resonance Breast Nipples-No Discharge. Breast Lump-No Palpable Breast Mass. Note: luoq mass and hematoma Cardiovascular Cardiovascular examination reveals -normal heart sounds, regular rate and rhythm with no murmurs. Lymphatic Head & Neck General Head & Neck Lymphatics: Bilateral - Description - Normal. Axillary General Axillary Region: Bilateral - Description - Normal. Note: no [place] adenopathy  Assessment/Plan BREAST NEOPLASM, TIS (DCIS), LEFT (D05.12) Story: Left breast seed bracketed lumpectomy and left axillary sentinel node biopsy I discussed right sided lesion on mri with follow up today with radiology. there is not an option for core biopsy. I think after discussion with radiology that a six month follow up mri on this side reasonable. discussed left sided seed bracketed lumpectomy and possibility of positive margins as well as possible need for mastectomy in future as well. Will also remove left breast implant   Cynitha Berte, MD 05/26/2017, 8:47 AM

## 2017-05-26 NOTE — Anesthesia Procedure Notes (Signed)
Procedure Name: LMA Insertion Date/Time: 05/26/2017 9:28 AM Performed by: Kimberly Nieland D Pre-anesthesia Checklist: Patient identified, Emergency Drugs available, Suction available and Patient being monitored Patient Re-evaluated:Patient Re-evaluated prior to induction Oxygen Delivery Method: Circle system utilized Preoxygenation: Pre-oxygenation with 100% oxygen Induction Type: IV induction Ventilation: Mask ventilation without difficulty LMA: LMA inserted LMA Size: 3.0 Number of attempts: 1 Airway Equipment and Method: Bite block Placement Confirmation: positive ETCO2 Tube secured with: Tape Dental Injury: Teeth and Oropharynx as per pre-operative assessment

## 2017-05-26 NOTE — Op Note (Signed)
Preoperative diagnosis: Left breast dcis, leaking implant Postoperative diagnosis: same as above Procedure: Leftbreast seed bracketed guided lumpectomy Leftdeep axillary sentinel node biopsy Removal left breast saline implant Surgeon: Dr Serita Grammes EBL: 30 cc Anes: general  Specimens  1. Left breast tissue marked with paint containing two seeds and both clips 2. Leftaxillary sentinel nodes with highest count 1429 3. Additional anteromedial and posterior margins marked short superior, long lateral and double deep Complications none Drains none Sponge count correct Dispo to pacu stable  Indications: This is a 20 yof with uoq area of calcifications that is dcis on core biopsy. She also during the evaluation was noted to have a leaking implant. Have discussed all options and have elected to proceed with left breast seed bracketed lumpectomy and left axillary sentinel node biopsy.  I will also remove the implant at the same time.  She had two seeds placed that I discussed with Dr Radford Pax. I had her mammograms in the OR.  Procedure: After informed consent was obtained the patient was taken to the operating room. She first was given technetium in standard periareolar fashion. She had a pectoral block. She was given antibiotics. Sequential compression devices were on her legs. She was then placed under general anesthesia with an LMA. Then she was prepped and draped in the standard sterile surgical fashion. Surgical timeout was then performed.  I then located the seeds in the lateral left breast. I infiltrated marcaine in the skin and then made a curvilinear incision overlying the seeds.  The anterior seed was very close to the skin and I removed a portion of skin overlying this.I then used the neoprobe to remove the seeds and the surrounding tissue with attempt to get clear margins. I marked this with paint. MM confirmed removal of seeds and clips. The calcifications appeared to go to the  posterior margin and I excised the posterior margin so now this is clear. There is no more tissue. Much of this was the implant capsule.  This was confirmed by radiology. I did also remove some additional medial margin as marked above. I placed clips in the cavity.I then obtained hemostasis. Via this same incision the implant was visualized and I had already excised capsule to get margins so I just removed the implant through the same incision. The implant was leaking in two holes.  I then entered into the axilla through the same incision. I was able to locate several sentinel nodes that were normal appearing.  These were excised.  The background radioactivity was negligible. I then obtained hemostasis. I closed the fascia with 2-0 vicryl.  The breast tissue was approximated with 2-0 vicryl and the implant capsule was also closed after irrigating.  The skin was closed with 3-0 vicryl and 4-0 monocryl. Glue and steristrips were applied. A binder was placed. She was extubated and transferred to PACU.

## 2017-05-26 NOTE — Anesthesia Preprocedure Evaluation (Signed)
Anesthesia Evaluation  Patient identified by MRN, date of birth, ID band Patient awake    Reviewed: Allergy & Precautions, NPO status , Patient's Chart, lab work & pertinent test results  Airway Mallampati: II  TM Distance: >3 FB Neck ROM: Full    Dental no notable dental hx.    Pulmonary neg pulmonary ROS,    Pulmonary exam normal breath sounds clear to auscultation       Cardiovascular negative cardio ROS Normal cardiovascular exam Rhythm:Regular Rate:Normal     Neuro/Psych Anxiety negative neurological ROS  negative psych ROS   GI/Hepatic negative GI ROS, Neg liver ROS,   Endo/Other  negative endocrine ROS  Renal/GU negative Renal ROS  negative genitourinary   Musculoskeletal negative musculoskeletal ROS (+)   Abdominal   Peds negative pediatric ROS (+)  Hematology negative hematology ROS (+)   Anesthesia Other Findings Breast Cancer  Reproductive/Obstetrics negative OB ROS                             Anesthesia Physical Anesthesia Plan  ASA: III  Anesthesia Plan: General   Post-op Pain Management: GA combined w/ Regional for post-op pain   Induction: Intravenous  PONV Risk Score and Plan: 3 and Ondansetron, Dexamethasone and Midazolam  Airway Management Planned: Oral ETT  Additional Equipment:   Intra-op Plan:   Post-operative Plan: Extubation in OR  Informed Consent: I have reviewed the patients History and Physical, chart, labs and discussed the procedure including the risks, benefits and alternatives for the proposed anesthesia with the patient or authorized representative who has indicated his/her understanding and acceptance.   Dental advisory given  Plan Discussed with: CRNA  Anesthesia Plan Comments:         Anesthesia Quick Evaluation

## 2017-05-26 NOTE — Anesthesia Procedure Notes (Signed)
Anesthesia Regional Block: Pectoralis block   Pre-Anesthetic Checklist: ,, timeout performed, Correct Patient, Correct Site, Correct Laterality, Correct Procedure, Correct Position, site marked, Risks and benefits discussed,  Surgical consent,  Pre-op evaluation,  At surgeon's request and post-op pain management  Laterality: Left  Prep: chloraprep       Needles:  Injection technique: Single-shot  Needle Type: Stimiplex     Needle Length: 9cm  Needle Gauge: 21     Additional Needles:   Procedures:,,,, ultrasound used (permanent image in chart),,,,  Narrative:  Start time: 05/26/2017 9:06 AM End time: 05/26/2017 9:11 AM Injection made incrementally with aspirations every 5 mL.  Performed by: Personally  Anesthesiologist: Candida Peeling RAY

## 2017-05-26 NOTE — Interval H&P Note (Signed)
History and Physical Interval Note:  05/26/2017 9:01 AM  [name]  has presented today for surgery, with the diagnosis of LEFT BREAST CANCER  The various methods of treatment have been discussed with the patient and family. After consideration of risks, benefits and other options for treatment, the patient has consented to  Procedure(s): LEFT BREAST LUMPECTOMY WITH BRACKETED RADIOACTIVE SEED AND SENTINEL LYMPH NODE BIOPSY (Left) as a surgical intervention .  The patient's history has been reviewed, patient examined, no change in status, stable for surgery.  I have reviewed the patient's chart and labs.  Questions were answered to the patient's satisfaction.     Terianne Thaker

## 2017-05-26 NOTE — Transfer of Care (Signed)
Immediate Anesthesia Transfer of Care Note  Patient: [name]  Procedure(s) Performed: LEFT BREAST LUMPECTOMY WITH BRACKETED RADIOACTIVE SEED AND SENTINEL LYMPH NODE BIOPSY (Left Breast) REMOVAL BREAST IMPLANT (Left Breast)  Patient Location: PACU  Anesthesia Type:GA combined with regional for post-op pain  Level of Consciousness: awake and patient cooperative  Airway & Oxygen Therapy: Patient Spontanous Breathing and Patient connected to face mask oxygen  Post-op Assessment: Report given to RN and Post -op Vital signs reviewed and stable  Post vital signs: Reviewed and stable  Last Vitals:  Vitals:   05/26/17 0915 05/26/17 0920  BP: 106/66   Pulse: 69 76  Resp: 15 19  Temp:    SpO2: 100% 100%    Last Pain:  Vitals:   05/26/17 0816  TempSrc: Oral         Complications: No apparent anesthesia complications

## 2017-05-26 NOTE — Progress Notes (Signed)
Assisted Dr. Miller with left, ultrasound guided, pectoralis block. Side rails up, monitors on throughout procedure. See vital signs in flow sheet. Tolerated Procedure well. 

## 2017-05-27 ENCOUNTER — Encounter (HOSPITAL_BASED_OUTPATIENT_CLINIC_OR_DEPARTMENT_OTHER): Payer: Self-pay | Admitting: General Surgery

## 2017-05-30 ENCOUNTER — Encounter (HOSPITAL_BASED_OUTPATIENT_CLINIC_OR_DEPARTMENT_OTHER): Payer: Self-pay | Admitting: *Deleted

## 2017-06-01 NOTE — H&P (Signed)
Subjective:     Patient ID: [name] is a 51 y.o. female.  HPI  Plan for revision augmentation bilateral. Presented with palpable mass left breast. MMG with left breast UOQ mass with associated pleomorphic coarse calcifications spanning an area of 3.4 cm. US demonstrated mass in the left breast at 2 o'clock 5 cmfn measuring 3.0 x 0.9 x 1.3 cm. No axillary adenopathy. Biopsy with high grade DCIS, ER+/PR-.  MRI with 5 mm oval focal enhancement abutting the implant on RIGHT, differential fat vs IM LN. Left breast with enhancement in the posterior UOQ LEFT breast abutting the implant, measures 3.5 x 1.0 x 2.8 cm. Second look [place] right did not demonstrate RIGHT breast mass, not amenable to MRI guided biopsy given implant. LEFT breast had additional MRI guided biopsy to mark posterior extent disease, biopsy again high grade DCIS. This repeat MRI demonstrated resolution right breast enhancement and plan 6 month follow up. However she has experienced significant asymmetry breasts post biopsy with rupture left breast implant.  She is now post lumpectomy with final pathology high grade DCIS margins clear 3.3 cm with ALH, 0/1 SLN.  History significant for augmentation mammaplasty with saline submuscular.implant cared states Mentor Smooth Round Moderate Profile Saline REF [phone] 375 ml implants, RIGHT fill 425 ml Left fill 405 ml.   Prior to augmentation B cup, current D. States if she did it over again, would like a little bigger.  Two children who are in college at this time. Her husband died 2 years ago following a sudden MI. Patient travels a lot for works, in [employer] of empty pill capsules, has office at home.     Objective:   Physical Exam  Constitutional: She is oriented to person, place, and time.  Cardiovascular: Normal rate.   Pulmonary/Chest: Effort normal.  Abdominal: Soft.  Lymphadenopathy:    She has no axillary adenopathy.  Neurological: She is alert and oriented to person,  place, and time.  Skin:  Fitzpatrick 2   Bilateral Baker 1, IMF scar 3.5 cm soft tissue pinch over implants, bilateral animation mild Left implant deflated SN to nipple R 21.5 L 23 cm BW R 16 L 17 Nipple to IMF R 12 L 11 Left ecchymoses resolving In supine position bilateral lateral displacement implant appr 3 cm    Assessment:     DCIS left breast Hx augmentation mammaplasty , deflation left implant post biopsy S/p left lumpectomy, SLN    Plan:      She has experienced left implant rupture post biopsy. Plan for lumpectomy and will await pathology for clearance margins prior to replacing implant. This will be one 7-10 d after lumpectomy, OP surgery. Plan continue with saline dual place. She desires larger overall size and we used sizer, approximate 125 ml more is desired. States she desires to replace the filling in her bra. Cosmetic quote provided today if not covered by insurance. Reviewed larger size would be more stretch on soft tissue, will not improve the lateral fall of implants, will not address any depressions contour from lumpectomy. New pictures today.  Discussed effect XRT on implants and breast including contour depressions, contraction volume, increased risk capsular contracture. Reviewed CC and its appearance; this is not dangerous but requires additional surgery to correct. Reviewed implant type (saline vs silicone) no real difference with regards to CC. Discussed even with additional surgery, CC risk always be present with implants, will always be higher risk on radiated side.  Reviewed fat grafting to lumpectomy. This is  possible, but fat grafting by itself as a procedure has a variable "take" of the graft, highest 40% in most studies. The remainder necrosis or is absorbed. This may manifest itself as lumps, cysts, calcifications on imaging. This can produce changes such as lumps or new calcs in the exact area of prior cancer. This can lead to additional biopsies,  anxiety with these.  Irene Limbo, MD [facility] Plastic & Reconstructive Surgery [phone], pin [phone]

## 2017-06-02 NOTE — Progress Notes (Signed)
Ensure pre surgery drink given with instructions to complete by 0730 dos, pt verbalized understanding. 

## 2017-06-03 ENCOUNTER — Ambulatory Visit (HOSPITAL_BASED_OUTPATIENT_CLINIC_OR_DEPARTMENT_OTHER): Payer: PRIVATE HEALTH INSURANCE | Admitting: Anesthesiology

## 2017-06-03 ENCOUNTER — Ambulatory Visit (HOSPITAL_BASED_OUTPATIENT_CLINIC_OR_DEPARTMENT_OTHER)
Admission: RE | Admit: 2017-06-03 | Discharge: 2017-06-03 | Disposition: A | Discharge status: Home / Self Care | Payer: PRIVATE HEALTH INSURANCE | Source: Ambulatory Visit | Attending: Plastic Surgery | Admitting: Plastic Surgery

## 2017-06-03 ENCOUNTER — Encounter (HOSPITAL_BASED_OUTPATIENT_CLINIC_OR_DEPARTMENT_OTHER)
Admission: RE | Disposition: A | Discharge status: Home / Self Care | Payer: Self-pay | Source: Ambulatory Visit | Attending: Plastic Surgery

## 2017-06-03 ENCOUNTER — Encounter (HOSPITAL_BASED_OUTPATIENT_CLINIC_OR_DEPARTMENT_OTHER): Payer: Self-pay | Admitting: *Deleted

## 2017-06-03 DIAGNOSIS — T8549XA Other mechanical complication of breast prosthesis and implant, initial encounter: Secondary | ICD-10-CM | POA: Diagnosis not present

## 2017-06-03 DIAGNOSIS — D0512 Intraductal carcinoma in situ of left breast: Secondary | ICD-10-CM | POA: Diagnosis present

## 2017-06-03 DIAGNOSIS — F419 Anxiety disorder, unspecified: Secondary | ICD-10-CM | POA: Diagnosis not present

## 2017-06-03 HISTORY — PX: BREAST IMPLANT EXCHANGE: SHX6296

## 2017-06-03 SURGERY — REPLACEMENT, IMPLANT, BREAST
Anesthesia: General | Laterality: Left

## 2017-06-03 MED ORDER — ONDANSETRON HCL 4 MG/2ML IJ SOLN
INTRAMUSCULAR | Status: AC
  Filled 2017-06-03: qty 2

## 2017-06-03 MED ORDER — OXYCODONE HCL 5 MG PO TABS: 5.0000 mg | ORAL_TABLET | Freq: Once | ORAL | Status: DC | PRN

## 2017-06-03 MED ORDER — HYDROMORPHONE HCL 1 MG/ML IJ SOLN: 0.2500 mg | INTRAMUSCULAR | Status: DC | PRN

## 2017-06-03 MED ORDER — CHLORHEXIDINE GLUCONATE CLOTH 2 % EX PADS: 6.0000 | MEDICATED_PAD | Freq: Once | CUTANEOUS | Status: DC

## 2017-06-03 MED ORDER — CELECOXIB 200 MG PO CAPS
ORAL_CAPSULE | ORAL | Status: AC
  Filled 2017-06-03: qty 1

## 2017-06-03 MED ORDER — DEXAMETHASONE SODIUM PHOSPHATE 4 MG/ML IJ SOLN
INTRAMUSCULAR | Status: DC | PRN
  Administered 2017-06-03: 10 mg via INTRAVENOUS

## 2017-06-03 MED ORDER — FENTANYL CITRATE (PF) 100 MCG/2ML IJ SOLN
INTRAMUSCULAR | Status: AC
  Filled 2017-06-03: qty 2

## 2017-06-03 MED ORDER — LACTATED RINGERS IV SOLN
INTRAVENOUS | Status: DC | PRN
  Administered 2017-06-03 (×2): via INTRAVENOUS

## 2017-06-03 MED ORDER — LACTATED RINGERS IV SOLN
INTRAVENOUS | Status: DC
  Administered 2017-06-03: 10:00:00 via INTRAVENOUS

## 2017-06-03 MED ORDER — MIDAZOLAM HCL 2 MG/2ML IJ SOLN
1.0000 mg | INTRAMUSCULAR | Status: DC | PRN
Start: 2017-06-03 — End: 2017-06-03

## 2017-06-03 MED ORDER — FENTANYL CITRATE (PF) 100 MCG/2ML IJ SOLN
INTRAMUSCULAR | Status: DC | PRN
  Administered 2017-06-03: 25 ug via INTRAVENOUS
  Administered 2017-06-03: 100 ug via INTRAVENOUS
  Administered 2017-06-03: 25 ug via INTRAVENOUS

## 2017-06-03 MED ORDER — LIDOCAINE 2% (20 MG/ML) 5 ML SYRINGE
INTRAMUSCULAR | Status: AC
  Filled 2017-06-03: qty 5

## 2017-06-03 MED ORDER — EPHEDRINE 5 MG/ML INJ
INTRAVENOUS | Status: AC
  Filled 2017-06-03: qty 10

## 2017-06-03 MED ORDER — PROMETHAZINE HCL 25 MG/ML IJ SOLN: 6.2500 mg | INTRAMUSCULAR | Status: DC | PRN

## 2017-06-03 MED ORDER — GABAPENTIN 300 MG PO CAPS
300.0000 mg | ORAL_CAPSULE | ORAL | Status: AC
  Administered 2017-06-03: 300 mg via ORAL

## 2017-06-03 MED ORDER — CEFAZOLIN SODIUM-DEXTROSE 2-4 GM/100ML-% IV SOLN
2.0000 g | INTRAVENOUS | Status: AC
  Administered 2017-06-03: 2 g via INTRAVENOUS

## 2017-06-03 MED ORDER — ACETAMINOPHEN 500 MG PO TABS
1000.0000 mg | ORAL_TABLET | ORAL | Status: AC
  Administered 2017-06-03: 1000 mg via ORAL

## 2017-06-03 MED ORDER — CELECOXIB 200 MG PO CAPS
200.0000 mg | ORAL_CAPSULE | ORAL | Status: AC
  Administered 2017-06-03: 200 mg via ORAL

## 2017-06-03 MED ORDER — CEFAZOLIN SODIUM-DEXTROSE 2-4 GM/100ML-% IV SOLN
INTRAVENOUS | Status: AC
  Filled 2017-06-03: qty 100

## 2017-06-03 MED ORDER — ONDANSETRON HCL 4 MG/2ML IJ SOLN
INTRAMUSCULAR | Status: DC | PRN
  Administered 2017-06-03: 4 mg via INTRAVENOUS

## 2017-06-03 MED ORDER — SCOPOLAMINE 1 MG/3DAYS TD PT72
1.0000 | MEDICATED_PATCH | Freq: Once | TRANSDERMAL | Status: DC | PRN
  Administered 2017-06-03: 1.5 mg via TRANSDERMAL

## 2017-06-03 MED ORDER — ROCURONIUM BROMIDE 10 MG/ML (PF) SYRINGE
PREFILLED_SYRINGE | INTRAVENOUS | Status: AC
  Filled 2017-06-03: qty 5

## 2017-06-03 MED ORDER — GABAPENTIN 300 MG PO CAPS
ORAL_CAPSULE | ORAL | Status: AC
  Filled 2017-06-03: qty 1

## 2017-06-03 MED ORDER — MEPERIDINE HCL 25 MG/ML IJ SOLN: 6.2500 mg | INTRAMUSCULAR | Status: DC | PRN

## 2017-06-03 MED ORDER — EPHEDRINE SULFATE 50 MG/ML IJ SOLN
INTRAMUSCULAR | Status: DC | PRN
  Administered 2017-06-03: 10 mg via INTRAVENOUS

## 2017-06-03 MED ORDER — ACETAMINOPHEN 500 MG PO TABS
ORAL_TABLET | ORAL | Status: AC
  Filled 2017-06-03: qty 2

## 2017-06-03 MED ORDER — OXYCODONE HCL 5 MG/5ML PO SOLN: 5.0000 mg | Freq: Once | ORAL | Status: DC | PRN

## 2017-06-03 MED ORDER — SULFAMETHOXAZOLE-TRIMETHOPRIM 800-160 MG PO TABS: 1.0000 | ORAL_TABLET | Freq: Two times a day (BID) | ORAL | 0 refills | Status: DC

## 2017-06-03 MED ORDER — LIDOCAINE HCL (CARDIAC) 20 MG/ML IV SOLN
INTRAVENOUS | Status: DC | PRN
  Administered 2017-06-03: 50 mg via INTRAVENOUS

## 2017-06-03 MED ORDER — FENTANYL CITRATE (PF) 100 MCG/2ML IJ SOLN: 50.0000 ug | INTRAMUSCULAR | Status: DC | PRN

## 2017-06-03 MED ORDER — GLYCOPYRROLATE 0.2 MG/ML IJ SOLN
INTRAMUSCULAR | Status: DC | PRN
  Administered 2017-06-03: 0.2 mg via INTRAVENOUS

## 2017-06-03 MED ORDER — MIDAZOLAM HCL 2 MG/2ML IJ SOLN
INTRAMUSCULAR | Status: AC
  Filled 2017-06-03: qty 2

## 2017-06-03 MED ORDER — PROPOFOL 10 MG/ML IV BOLUS
INTRAVENOUS | Status: AC
  Filled 2017-06-03: qty 20

## 2017-06-03 MED ORDER — DEXAMETHASONE SODIUM PHOSPHATE 10 MG/ML IJ SOLN
INTRAMUSCULAR | Status: AC
  Filled 2017-06-03: qty 1

## 2017-06-03 MED ORDER — MIDAZOLAM HCL 2 MG/2ML IJ SOLN
INTRAMUSCULAR | Status: DC | PRN
  Administered 2017-06-03: 2 mg via INTRAVENOUS

## 2017-06-03 SURGICAL SUPPLY — 71 items
ADH SKN CLS APL DERMABOND .7 (GAUZE/BANDAGES/DRESSINGS) ×4
BAG DECANTER FOR FLEXI CONT (MISCELLANEOUS) ×3 IMPLANT
BINDER BREAST LRG (GAUZE/BANDAGES/DRESSINGS) ×3 IMPLANT
BINDER BREAST MEDIUM (GAUZE/BANDAGES/DRESSINGS) IMPLANT
BINDER BREAST XLRG (GAUZE/BANDAGES/DRESSINGS) IMPLANT
BINDER BREAST XXLRG (GAUZE/BANDAGES/DRESSINGS) IMPLANT
BLADE SURG 10 STRL SS (BLADE) ×3 IMPLANT
BLADE SURG 15 STRL LF DISP TIS (BLADE) IMPLANT
BLADE SURG 15 STRL SS (BLADE)
BNDG GAUZE ELAST 4 BULKY (GAUZE/BANDAGES/DRESSINGS) ×6 IMPLANT
CANISTER SUCT 1200ML W/VALVE (MISCELLANEOUS) ×3 IMPLANT
CHLORAPREP W/TINT 26ML (MISCELLANEOUS) ×6 IMPLANT
COVER BACK TABLE 60X90IN (DRAPES) ×3 IMPLANT
COVER MAYO STAND STRL (DRAPES) ×3 IMPLANT
COVER SURGICAL LIGHT HANDLE (MISCELLANEOUS) ×3 IMPLANT
DECANTER SPIKE VIAL GLASS SM (MISCELLANEOUS) IMPLANT
DERMABOND ADVANCED (GAUZE/BANDAGES/DRESSINGS) ×2
DERMABOND ADVANCED .7 DNX12 (GAUZE/BANDAGES/DRESSINGS) ×4 IMPLANT
DRAIN CHANNEL 15F RND FF W/TCR (WOUND CARE) IMPLANT
DRAPE INCISE IOBAN 66X45 STRL (DRAPES) IMPLANT
DRAPE TOP ARMCOVERS (MISCELLANEOUS) ×3 IMPLANT
DRAPE U-SHAPE 47X51 STRL (DRAPES) ×3 IMPLANT
DRAPE U-SHAPE 76X120 STRL (DRAPES) ×3 IMPLANT
DRAPE UTILITY XL STRL (DRAPES) IMPLANT
DRSG PAD ABDOMINAL 8X10 ST (GAUZE/BANDAGES/DRESSINGS) ×6 IMPLANT
ELECT BLADE 4.0 EZ CLEAN MEGAD (MISCELLANEOUS) ×3
ELECT BLADE 6.5 .24CM SHAFT (ELECTRODE) IMPLANT
ELECT COATED BLADE 2.86 ST (ELECTRODE) ×3 IMPLANT
ELECT REM PT RETURN 9FT ADLT (ELECTROSURGICAL) ×3
ELECTRODE BLDE 4.0 EZ CLN MEGD (MISCELLANEOUS) ×2 IMPLANT
ELECTRODE REM PT RTRN 9FT ADLT (ELECTROSURGICAL) ×2 IMPLANT
EVACUATOR SILICONE 100CC (DRAIN) IMPLANT
GAUZE SPONGE 4X4 12PLY STRL (GAUZE/BANDAGES/DRESSINGS) IMPLANT
GAUZE SPONGE 4X4 12PLY STRL LF (GAUZE/BANDAGES/DRESSINGS) IMPLANT
GLOVE BIO SURGEON STRL SZ 6 (GLOVE) ×9 IMPLANT
GLOVE BIO SURGEON STRL SZ 6.5 (GLOVE) ×6 IMPLANT
GLOVE BIOGEL PI IND STRL 7.0 (GLOVE) ×2 IMPLANT
GLOVE BIOGEL PI INDICATOR 7.0 (GLOVE) ×1
GOWN STRL REUS W/ TWL LRG LVL3 (GOWN DISPOSABLE) ×4 IMPLANT
GOWN STRL REUS W/TWL LRG LVL3 (GOWN DISPOSABLE) ×6
IMPL BREAST MP 425CC (Breast) ×4 IMPLANT
IMPLANT BREAST MP 425CC (Breast) ×6 IMPLANT
KIT FILL SYSTEM UNIVERSAL (SET/KITS/TRAYS/PACK) IMPLANT
MARKER SKIN DUAL TIP RULER LAB (MISCELLANEOUS) IMPLANT
NEEDLE HYPO 25X1 1.5 SAFETY (NEEDLE) IMPLANT
NS IRRIG 1000ML POUR BTL (IV SOLUTION) ×3 IMPLANT
PACK BASIN DAY SURGERY FS (CUSTOM PROCEDURE TRAY) ×3 IMPLANT
PENCIL BUTTON HOLSTER BLD 10FT (ELECTRODE) ×3 IMPLANT
PIN SAFETY STERILE (MISCELLANEOUS) IMPLANT
SHEET MEDIUM DRAPE 40X70 STRL (DRAPES) ×6 IMPLANT
SLEEVE SCD COMPRESS KNEE MED (MISCELLANEOUS) ×3 IMPLANT
SPONGE LAP 18X18 X RAY DECT (DISPOSABLE) ×6 IMPLANT
STAPLER VISISTAT 35W (STAPLE) ×3 IMPLANT
STRIP CLOSURE SKIN 1/2X4 (GAUZE/BANDAGES/DRESSINGS) IMPLANT
SUT ETHILON 2 0 FS 18 (SUTURE) IMPLANT
SUT MNCRL AB 4-0 PS2 18 (SUTURE) ×9 IMPLANT
SUT PDS AB 2-0 CT2 27 (SUTURE) ×6 IMPLANT
SUT VIC AB 3-0 PS1 18 (SUTURE)
SUT VIC AB 3-0 PS1 18XBRD (SUTURE) IMPLANT
SUT VIC AB 3-0 SH 27 (SUTURE) ×6
SUT VIC AB 3-0 SH 27X BRD (SUTURE) ×4 IMPLANT
SUT VICRYL 4-0 PS2 18IN ABS (SUTURE) ×6 IMPLANT
SWAB COLLECTION DEVICE MRSA (MISCELLANEOUS) IMPLANT
SWAB CULTURE ESWAB REG 1ML (MISCELLANEOUS) IMPLANT
SYR 50ML LL SCALE MARK (SYRINGE) IMPLANT
SYR BULB IRRIGATION 50ML (SYRINGE) ×6 IMPLANT
SYR CONTROL 10ML LL (SYRINGE) IMPLANT
TOWEL OR 17X24 6PK STRL BLUE (TOWEL DISPOSABLE) ×6 IMPLANT
TUBE CONNECTING 20X1/4 (TUBING) ×6 IMPLANT
UNDERPAD 30X30 (UNDERPADS AND DIAPERS) ×6 IMPLANT
YANKAUER SUCT BULB TIP NO VENT (SUCTIONS) ×9 IMPLANT

## 2017-06-03 NOTE — Anesthesia Procedure Notes (Signed)
Procedure Name: LMA Insertion Date/Time: 06/03/2017 10:58 AM Performed by: Rayvon Char Pre-anesthesia Checklist: Patient identified, Emergency Drugs available, Suction available and Patient being monitored Patient Re-evaluated:Patient Re-evaluated prior to induction Oxygen Delivery Method: Circle system utilized Preoxygenation: Pre-oxygenation with 100% oxygen Induction Type: IV induction Ventilation: Mask ventilation without difficulty LMA: LMA inserted LMA Size: 3.0 Number of attempts: 1 Placement Confirmation: breath sounds checked- equal and bilateral and positive ETCO2

## 2017-06-03 NOTE — Transfer of Care (Signed)
Immediate Anesthesia Transfer of Care Note  Patient: [name]  Procedure(s) Performed: REMOVAL BREAST IMPLANTS WITH REPLACEMENT RIGHT, LEFT BREAST IMPLANT PLACEMEMT (Bilateral )  Patient Location: PACU  Anesthesia Type:General  Level of Consciousness: awake, alert  and oriented  Airway & Oxygen Therapy: Patient Spontanous Breathing and Patient connected to face mask oxygen  Post-op Assessment: Report given to RN and Post -op Vital signs reviewed and stable  Post vital signs: Reviewed and stable  Last Vitals:  Vitals:   06/03/17 1005  BP: 110/75  Pulse: 84  Resp: 16  Temp: 36.8 C    Last Pain:  Vitals:   06/03/17 1005  TempSrc: Oral  PainSc: 2       Patients Stated Pain Goal: 3 (09/81/19 1478)  Complications: No apparent anesthesia complications

## 2017-06-03 NOTE — Anesthesia Postprocedure Evaluation (Signed)
Anesthesia Post Note  Patient: [name]  Procedure(s) Performed: REMOVAL BREAST IMPLANTS WITH REPLACEMENT RIGHT, LEFT BREAST IMPLANT PLACEMEMT (Bilateral )     Patient location during evaluation: PACU Anesthesia Type: General Level of consciousness: sedated Pain management: pain level controlled Vital Signs Assessment: post-procedure vital signs reviewed and stable Respiratory status: spontaneous breathing and respiratory function stable Cardiovascular status: stable Postop Assessment: no apparent nausea or vomiting Anesthetic complications: no    Last Vitals:  Vitals:   06/03/17 1300 06/03/17 1340  BP: 127/84 (!) 137/95  Pulse: 78 76  Resp: 17 18  Temp:  36.4 C  SpO2: 100% 100%    Last Pain:  Vitals:   06/03/17 1340  TempSrc: Oral  PainSc: 0-No pain                 Dewel Lotter DANIEL

## 2017-06-03 NOTE — Interval H&P Note (Signed)
History and Physical Interval Note:  06/03/2017 9:52 AM  [name]  has presented today for surgery, with the diagnosis of LEFT BREAST DCIS RUPTURED LEFT BREAST IMPLANT  The various methods of treatment have been discussed with the patient and family. After consideration of risks, benefits and other options for treatment, the patient has consented to  Procedure(s): LEFT REMOVAL BREAST IMPLANTS WITH REPLACEMENT POSSIBLE RIGHT (Left) as a surgical intervention .  The patient's history has been reviewed, patient examined, no change in status, stable for surgery.  I have reviewed the patient's chart and labs.  Questions were answered to the patient's satisfaction.     Berl Bonfanti

## 2017-06-03 NOTE — Anesthesia Preprocedure Evaluation (Signed)
Anesthesia Evaluation  Patient identified by MRN, date of birth, ID band Patient awake    Reviewed: Allergy & Precautions, NPO status , Patient's Chart, lab work & pertinent test results  Airway Mallampati: II  TM Distance: >3 FB Neck ROM: Full    Dental no notable dental hx.    Pulmonary neg pulmonary ROS,    Pulmonary exam normal breath sounds clear to auscultation       Cardiovascular negative cardio ROS Normal cardiovascular exam Rhythm:Regular Rate:Normal     Neuro/Psych Anxiety negative neurological ROS  negative psych ROS   GI/Hepatic negative GI ROS, Neg liver ROS,   Endo/Other  negative endocrine ROS  Renal/GU negative Renal ROS     Musculoskeletal negative musculoskeletal ROS (+)   Abdominal   Peds  Hematology negative hematology ROS (+)   Anesthesia Other Findings Breast Cancer  Reproductive/Obstetrics negative OB ROS                             Anesthesia Physical  Anesthesia Plan  ASA: III  Anesthesia Plan: General   Post-op Pain Management:    Induction: Intravenous  PONV Risk Score and Plan: 3 and Ondansetron, Dexamethasone and Midazolam  Airway Management Planned: LMA  Additional Equipment:   Intra-op Plan:   Post-operative Plan: Extubation in OR  Informed Consent: I have reviewed the patients History and Physical, chart, labs and discussed the procedure including the risks, benefits and alternatives for the proposed anesthesia with the patient or authorized representative who has indicated his/her understanding and acceptance.   Dental advisory given  Plan Discussed with: CRNA  Anesthesia Plan Comments:         Anesthesia Quick Evaluation

## 2017-06-03 NOTE — Discharge Instructions (Signed)

## 2017-06-03 NOTE — Op Note (Signed)
Operative Note   DATE OF OPERATION: 10.19.18  LOCATION: [facility]-outpatient  SURGICAL DIVISION: Plastic Surgery  PREOPERATIVE DIAGNOSES:  1. Left breast DCIS 2. Ruptured left breast implant 3. History augmentation mammaplasty  POSTOPERATIVE DIAGNOSES:  same  PROCEDURE:  Bilateral breast revision augmentation with saline implants  SURGEON: Irene Limbo MD MBA  ASSISTANT: none  ANESTHESIA:  General.   EBL: 15 ml  COMPLICATIONS: None immediate.   INDICATIONS FOR PROCEDURE:  The patient, [name], is a 51 y.o. female born on February 09, 1966, is here for revision augmentation. She has a history of prior dual plane augmentation and recent diagnosis of left breast cancer. Following biopsy she experienced left breast implant deflation. She has undergone left lumpectomy with clear margins and removal left implant.   FINDINGS: Removed intact Smooth Mentor saline implant 375 ml MP from right breast, dual plane position. Placed Smooth Round Mentor Moderate Profile [place] 425 ml implants bilateral. Right fill volume 475 ml, left fill volume 430 ml REF [id number] RIGHT SN [id number] LEFT SN [id number]  DESCRIPTION OF PROCEDURE:  The patient's operative site was marked with the patient in the preoperative area. The patient was taken to the operating room. SCDs were placed and IV antibiotics were given. The patient's operative site was prepped and draped in a sterile fashion. A time out was performed and all information was confirmed to be correct. Incision made in right IMF scar and carried to implant capsule. Implant removed intact. Examination of capsule noted this capsule that extended onto anterior abdominal wall. Popcorn capsulorraphy performed over lateral capsule. Inframammary fold stabilized with interrupted 2-0 PDS from chest wall to caudal incision superficial fascia. Cavity irrigated with saline solution containing Ancef, gentamicin, and bacitracin followed by Betadine. Implant  prepared and placed in cavity. Implant filled to 475 ml. Fill tubing removed, tab closure and proper orientation confirmed.  Closure completed with 3-0 vicryl to approximate capsule and superficial fascia followed by 4-0 vicryl in dermis and 4-0 monocryl subcuticular closure.   I then directed attention to left breast. Incision made in IMF scar and carried to implant capsule. Small seroma and hematoma noted from recent lumpectomy. Implant removed intact. Examination of capsule noted this capsule that extended onto anterior abdominal wall similar to right.  Inframammary fold stabilized with interrupted 2-0 PDS from chest wall to caudal incision superficial fascia. Cavity irrigated with saline solution containing Ancef, gentamicin, and bacitracin followed by Betadine. Implant prepared and placed in cavity. Patient brought to upright sitting position. Implant filled to 430 ml. Fill tubing removed, tab closure and proper orientation confirmed. She was returned to supine position. Closure completd in similar fashion. Tissue adhesive applied.  The patient was allowed to wake from anesthesia, extubated and taken to the recovery room in satisfactory condition.   SPECIMENS: none  DRAINS: none  Irene Limbo, MD [facility] Plastic & Reconstructive Surgery [phone], pin [phone]

## 2017-06-06 ENCOUNTER — Encounter (HOSPITAL_BASED_OUTPATIENT_CLINIC_OR_DEPARTMENT_OTHER): Payer: Self-pay | Admitting: Plastic Surgery

## 2017-06-08 ENCOUNTER — Encounter: Payer: Self-pay | Admitting: Adult Health

## 2017-06-08 DIAGNOSIS — Z170 Estrogen receptor positive status [ER+]: Secondary | ICD-10-CM | POA: Insufficient documentation

## 2017-06-08 DIAGNOSIS — C50412 Malignant neoplasm of upper-outer quadrant of left female breast: Secondary | ICD-10-CM | POA: Insufficient documentation

## 2017-06-09 ENCOUNTER — Encounter: Payer: Self-pay | Admitting: Radiation Oncology

## 2017-06-13 NOTE — Progress Notes (Signed)
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  Receptor Status: ER(40%), PR (NEG)  04/25/17 Diagnosis Breast, left, needle core biopsy, upper, outer quadrant - DUCTAL CARCINOMA IN SITU WITH CALCIFICATIONS.  Receptor Status: ER (30%), PR (NEG)  05/26/17 Diagnosis 1. Breast, lumpectomy, Left - DUCTAL CARCINOMA IN SITU, HIGH GRADE, WITH NECROSIS AND CALCIFICATIONS (SPANNING 3.3 CM) - MEDIAL MARGIN INVOLVED BY DUCTAL CARCINOMA IN SITU (SEE PART 4 FOR FINAL MARGIN STATUS) - PREVIOUS BIOPSY SITE CHANGES - FIBROCYSTIC CHANGES INCLUDING APOCRINE METAPLASIA - ATYPICAL LOBULAR HYPERPLASIA - INTRADUCTAL PAPILLOMA - DUCT ECTASIA - SEE ONCOLOGY TABLE 2. Breast, implant only, Left - GROSS DIAGNOSIS ONLY: DEVICE CONSISTENT WITH CLINICALLY STATED BREAST IMPLANT, LEFT 3. Breast, excision, Left additional Posterior Margin - FIBROUS CAPSULE WITH CHRONIC INFLAMMATION - BENIGN SKELETAL MUSCLE - NO RESIDUAL CARCINOMA IDENTIFIED 4. Breast, excision, Left Anteromedial Margin - USUAL DUCTAL HYPERPLASIA - FIBROCYSTIC CHANGES INCLUDING APOCRINE METAPLASIA - DUCT ECTASIA - NO RESIDUAL CARCINOMA IDENTIFIED 5. Lymph node, sentinel, biopsy, Left Axillary - NO CARCINOMA IDENTIFIED IN ONE LYMPH NODE (0/1) 6. Lymph node, sentinel, biopsy, Left - FIBROCYSTIC CHANGES INCLUDING APOCRINE METAPLASIA - NO LYMPHOID TISSUE OR CARCINOMA IDENTIFIED 7. Lymph node, sentinel, biopsy, Left - FIBROADENOMA - FIBROCYSTIC CHANGES INCLUDING APOCRINE METAPLASIA - NO LYMPHOID TISSUE OR CARCINOMA IDENTIFIED  Did patient present with symptoms or was this found on screening mammography?: She self palpated the mass and went for evaluation  Past/Anticipated interventions by surgeon, if any: 05/26/17 Procedure:  Leftbreast seed bracketed guided lumpectomy Leftdeep axillary sentinel node biopsy Removal left breast saline implant Surgeon: Dr Serita Grammes  06/03/17 PROCEDURE:  Bilateral breast revision augmentation with saline implants SURGEON: Irene Limbo MD MBA   Past/Anticipated interventions by medical oncology, if any: No  Lymphedema issues, if any: She denies. She has good arm mobility.   Pain issues, if any: No  SAFETY ISSUES:  Prior radiation? No  Pacemaker/ICD? No  Possible current pregnancy? No       Is the patient on methotrexate? No   Current Complaints / other details:   BP 116/86   Pulse 78   Temp 98.3 F (36.8 C)   Ht 5\' 6"  (1.676 m)   Wt 144 lb 6.4 oz (65.5 kg)   SpO2 100% Comment: room air  BMI 23.31 kg/m    Wt Readings from Last 3 Encounters:  06/17/17 144 lb 6.4 oz (65.5 kg)  05/30/17 142 lb (64.4 kg)  05/26/17 142 lb (64.4 kg)

## 2017-06-17 ENCOUNTER — Encounter: Payer: Self-pay | Admitting: Radiation Oncology

## 2017-06-17 ENCOUNTER — Ambulatory Visit
Admission: RE | Admit: 2017-06-17 | Discharge: 2017-06-17 | Disposition: A | Discharge status: Home / Self Care | Payer: PRIVATE HEALTH INSURANCE | Source: Ambulatory Visit | Attending: Radiation Oncology | Admitting: Radiation Oncology

## 2017-06-17 VITALS — BP 116/86 | HR 78 | Temp 98.3°F | Ht 66.0 in | Wt 144.4 lb

## 2017-06-17 DIAGNOSIS — Z170 Estrogen receptor positive status [ER+]: Principal | ICD-10-CM

## 2017-06-17 DIAGNOSIS — C50412 Malignant neoplasm of upper-outer quadrant of left female breast: Secondary | ICD-10-CM

## 2017-06-17 NOTE — Progress Notes (Signed)
Radiation Oncology         [phone] ________________________________  Name: [name] MRN: [id number]  Date: 06/17/2017  DOB: Aug 04, 1966  Follow-Up Visit Note  Outpatient  CC: Elby Showers, MD  Rolm Bookbinder, MD  Diagnosis:      ICD-10-CM   1. Malignant neoplasm of upper-outer quadrant of left breast in female, estrogen receptor positive ([place]) C50.412 MM DIAG BREAST TOMO UNI LEFT   Z17.0    Stage 0 TisN0M0 Left Breast UOQ  Ductal Carcinoma In Situ, ER (40%+ weak staining)  / PR (NEG) High Grade  CHIEF COMPLAINT: Here to discuss management of left breast cancer  Narrative:  The patient returns today for follow-up.     Since consultation, she underwent left breast lumpectomy with sentinel lymph node biopsy and implant removal on 05/26/2017. Pathology revealed high grade DCIS with necrosis and calcifications, spanning 3.3 cm. Margins and lymph nodes were negative for carcinoma. She additionally underwent bilateral breast implant replacement on 06/03/2017.   The patient presents today accompanied by her boyfriend to discuss adjuvant radiotherapy. She denies any major side effects or issues related to her recent surgeries.           ALLERGIES:  has No Known Allergies.  Meds: Current Outpatient Prescriptions  Medication Sig Dispense Refill  . cyclobenzaprine (FLEXERIL) 10 MG tablet Take 1 tablet (10 mg total) by mouth 3 (three) times daily as needed for muscle spasms. 30 tablet 0  . meloxicam (MOBIC) 15 MG tablet TAKE 1 TABLET BY MOUTH ONCE DAILY 90 tablet 0  . nitrofurantoin, macrocrystal-monohydrate, (MACROBID) 100 MG capsule     . oxyCODONE (OXY IR/ROXICODONE) 5 MG immediate release tablet Take 1 tablet (5 mg total) by mouth every 6 (six) hours as needed for moderate pain, severe pain or breakthrough pain. (Patient not taking: Reported on 06/17/2017) 11 tablet 0  . valACYclovir (VALTREX) 500 MG tablet Take 1 tablet (500 mg total) by mouth 2 (two) times daily.  (Patient not taking: Reported on 06/17/2017) 10 tablet 3   No current facility-administered medications for this encounter.       Physical Findings:  height is '5\' 6"'$  (1.676 m) and weight is 144 lb 6.4 oz (65.5 kg). Her temperature is 98.3 F (36.8 C). Her blood pressure is 116/86 and her pulse is 78. Her oxygen saturation is 100%. .     General: Alert and oriented, in no acute distress. HEENT: Head is normocephalic. Neck: Neck is supple, no palpable cervical or supraclavicular lymphadenopathy. Lymphatics: see Neck Exam Musculoskeletal: Decent ROM in the left shoulder. Neurologic: No obvious focalities. Speech is fluent.  Psychiatric: Judgment and insight are intact. Affect is appropriate. Breasts: Around the 3:00 position of the left breast, she has a lumpectomy scar that is healing well. +implants  Lab Findings: Lab Results  Component Value Date   WBC 4.7 12/08/2015   HGB 14.3 12/08/2015   HCT 42.6 12/08/2015   MCV 91.6 12/08/2015   PLT 242 12/08/2015      Radiographic Findings: Nm Sentinel Node Inj-no Rpt (breast)  Result Date: 06/15/2017 There is no Radiologist interpretation  for this exam.  Mm Breast Surgical Specimen  Result Date: 05/26/2017 CLINICAL DATA:  51 year old female post bracketed left breast lumpectomy for high grade ductal carcinoma in situ. EXAM: SPECIMEN RADIOGRAPH OF THE LEFT BREAST COMPARISON:  Previous exam(s). FINDINGS: Status post excision of the left breast. The radioactive seeds and biopsy marker clips are present, completely intact, and were marked for pathology. The  linear branching calcifications extent 1.7 cm beyond the coil shaped biopsy marking clip and to the edge of the film. This was discussed with the nurse in the operating room at 10 a.m. 05/26/2017. IMPRESSION: Specimen radiograph of the left breast. The linear branching calcifications extent 1.7 cm beyond the coil shaped biopsy marking clip and to the edge of the film. This was discussed  with the nurse in the operating room at 10 a.m. 05/26/2017. Electronically Signed   By: Everlean Alstrom M.D.   On: 05/26/2017 10:03   Mm Lt Radioactive Seed Loc Mammo Guide  Result Date: 05/25/2017 CLINICAL DATA:  Recent diagnosis of high-grade ductal carcinoma in situ of the upper-outer quadrant of the left breast following 2 separate ultrasound-guided core needle biopsies, with both the anterior and posterior extent of a hypoechoic mass with associated calcifications recently biopsied. The patient states today that her left breast implant has collapsed, and she plans to have it removed at the time of surgery this week. It is requested that the biopsy proven malignancy in the upper-outer quadrant of the left breast bracketed with 2 seeds. I discussed the case with Dr. Donne Hazel today, and he is aware that due to the far posterior positioning of the posterior calcifications that I will be unable to put the seed at the posterior most extent of the calcifications, but it will be placed as far posteriorly as possible with mammographic guidance. This report describes the seed localization of the anterior aspect of the calcifications. EXAM: MAMMOGRAPHIC GUIDED RADIOACTIVE SEED LOCALIZATION OF THE LEFT BREAST COMPARISON:  Previous exam(s). FINDINGS: Patient presents for radioactive seed localization with bracketing prior to lumpectomy. I met with the patient and we discussed the procedure of seed localization including benefits and alternatives. We discussed the high likelihood of a successful procedure. We discussed the risks of the procedure including infection, bleeding, tissue injury, possible puncture of the breast implant, and further surgery. We discussed the low dose of radioactivity involved in the procedure. Informed, written consent was given. The usual time-out protocol was performed immediately prior to the procedure. Using mammographic guidance, sterile technique, 1% lidocaine and an I-125 radioactive  seed, the ribbon shaped biopsy clip and anterior extent of the calcifications in the upper outer left breast were localized using a superior to inferior approach. The follow-up mammogram images confirm the seed in the expected location and were marked for Dr. Donne Hazel. Follow-up survey of the patient confirms presence of the radioactive seed. Order number of I-125 seed:  [id number]. Total activity:  0.246 mCi  Reference Date: 23 May 2017 The patient tolerated the procedure well and was released from the [facility]. She was given instructions regarding seed removal. IMPRESSION: Radioactive seed localization left breast. No apparent complications. Electronically Signed   By: Curlene Dolphin M.D.   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 The patient states today that her left breast implant has collapsed, and she plans to have it removed at the time of surgery this week. It is requested that the biopsy proven malignancy in the upper-outer quadrant of the left breast bracketed with 2 seeds. I discussed the case with Dr. Donne Hazel today, and he is aware that due  to the far posterior positioning of the posterior calcifications that I will be unable to put the seed at the posterior most extent of the calcifications, but it will be placed as far posteriorly as possible with mammographic guidance. This report describes the seed placed along the posterior extent of the calcifications. Different patient positions were attempted prior to final placement of this seed. EXAM: MAMMOGRAPHIC GUIDED RADIOACTIVE SEED LOCALIZATION OF THE LEFT BREAST COMPARISON:  Previous exam(s). FINDINGS:  Patient presents for radioactive seed localization prior to lumpectomy. I met with the patient and we discussed the procedure of seed localization including benefits and alternatives. We discussed the high likelihood of a successful procedure. We discussed the risks of the procedure including infection, bleeding, tissue injury and further surgery. We discussed the low dose of radioactivity involved in the procedure. Informed, written consent was given. The usual time-out protocol was performed immediately prior to the procedure. Using mammographic guidance, sterile technique, 1% lidocaine and an I-125 radioactive seed, the more posterior aspect of the calcifications, but not the extreme posterior aspect was localized using a lateral to medial approach. The follow-up mammogram images confirm the seed in the expected location and were marked for Dr. Donne Hazel. Follow-up survey of the patient confirms presence of the radioactive seed. Order number of I-125 seed:  [id number]. Total activity:  0.250 mCi  Reference Date: 20 May 2017 The patient tolerated the procedure well and was released from the [facility]. She was given instructions regarding seed removal. IMPRESSION: Radioactive seed localization left breast. No apparent complications. Electronically Signed   By: Curlene Dolphin M.D.   On: 05/25/2017 17:02    Impression/Plan: Left breast DCIS We discussed adjuvant radiotherapy today. We also discussed that I would like to order a post-operative mammogram for the last week of November to rule out any residual DCIS, with radiation to follow afterwards.  I recommend radiotherapy to the left breast in order to reduce risk of local recurrence by 1/2.  The risks, benefits and side effects of this treatment were discussed in detail.  She understands that radiotherapy is associated with skin irritation and fatigue in the acute setting. Late effects can include cosmetic changes and rare injury to internal organs.   She  is enthusiastic about proceeding with treatment. A consent form has been signed and placed in her chart.  A total of 3 medically necessary complex treatment devices will be fabricated and supervised by me: 2 fields with MLCs for custom blocks to protect heart, and lungs;  and, a Vac-lok. MORE COMPLEX DEVICES MAY BE MADE IN DOSIMETRY FOR FIELD IN FIELD BEAMS FOR DOSE HOMOGENEITY.  I have requested : 3D Simulation which is medically necessary to give adequate dose to at risk tissues while sparing lungs and heart.  I have requested a DVH of the following structures: lungs, heart, left lumpectomy cavity.    The patient will receive 50.4 Gy in 28 fractions to the left breast with 2 fields.  This will be followed by a boost.  I spent 45 minutes face to face with the patient and more than 50% of that time was spent in counseling and/or coordination of care. _____________________________________   Eppie Gibson, MD  This document serves as a record of services personally performed by Eppie Gibson, MD. It was created on her behalf by Rae Lips, a trained medical scribe. The creation of this record is based on the scribe's personal observations and the provider's statements to them. This document has been checked and  approved by the attending provider.

## 2017-07-11 ENCOUNTER — Ambulatory Visit
Admission: RE | Admit: 2017-07-11 | Discharge: 2017-07-11 | Disposition: A | Discharge status: Home / Self Care | Payer: PRIVATE HEALTH INSURANCE | Source: Ambulatory Visit | Attending: Radiation Oncology | Admitting: Radiation Oncology

## 2017-07-11 ENCOUNTER — Other Ambulatory Visit: Payer: Self-pay | Admitting: Radiation Oncology

## 2017-07-11 DIAGNOSIS — Z170 Estrogen receptor positive status [ER+]: Principal | ICD-10-CM

## 2017-07-11 DIAGNOSIS — C50412 Malignant neoplasm of upper-outer quadrant of left female breast: Secondary | ICD-10-CM

## 2017-07-12 ENCOUNTER — Ambulatory Visit
Admission: RE | Admit: 2017-07-12 | Discharge: 2017-07-12 | Disposition: A | Discharge status: Home / Self Care | Payer: PRIVATE HEALTH INSURANCE | Source: Ambulatory Visit | Attending: Radiation Oncology | Admitting: Radiation Oncology

## 2017-07-12 DIAGNOSIS — Z170 Estrogen receptor positive status [ER+]: Principal | ICD-10-CM

## 2017-07-12 DIAGNOSIS — C50412 Malignant neoplasm of upper-outer quadrant of left female breast: Secondary | ICD-10-CM | POA: Diagnosis not present

## 2017-07-12 NOTE — Progress Notes (Signed)
Radiation Oncology         ([phone]) [phone] ________________________________  Name: [name] MRN: [id number]  Date: 07/12/2017  DOB: 08/25/1965  SIMULATION AND TREATMENT PLANNING NOTE  / Special treatment procedure  Outpatient  DIAGNOSIS:     ICD-10-CM   1. Malignant neoplasm of upper-outer quadrant of left breast in female, estrogen receptor positive ([place]) C50.412    Z17.0     NARRATIVE:  The patient was brought to the [facility].  Identity was confirmed.  All relevant records and images related to the planned course of therapy were reviewed.  The patient freely provided informed written consent to proceed with treatment after reviewing the details related to the planned course of therapy. The consent form was witnessed and verified by the simulation staff.    Then, the patient was set-up in a stable reproducible supine position for radiation therapy with her ipsilateral arm over her head, and her upper body secured in a custom-made Vac-lok device.  CT images were obtained.  Surface markings were placed.  The CT images were loaded into the planning software.    Special treatment procedure was performed today due to the extra time and effort required by myself to plan and prepare this patient for deep inspiration breath hold technique.  I have determined cardiac sparing to be of benefit to this patient to prevent long term cardiac damage due to radiation of the heart.  Bellows were placed on the patient's abdomen. To facilitate cardiac sparing, the patient was coached by the radiation therapists on breath hold techniques and breathing practice was performed. Practice waveforms were obtained. The patient was then scanned while maintaining breath hold in the treatment position.  This image was then transferred over to the imaging specialist. The imaging specialist then created a fusion of the free breathing and breath hold scans using the chest wall as the stable structure.  I personally reviewed the fusion in axial, coronal and sagittal image planes.  Excellent cardiac sparing was obtained.  I felt the patient is an appropriate candidate for breath hold and the patient will be treated as such.  The image fusion was then reviewed with the patient to reinforce the necessity of reproducible breath hold.  TREATMENT PLANNING NOTE: Treatment planning then occurred.  The radiation prescription was entered and confirmed.     A total of 3 medically necessary complex treatment devices were fabricated and supervised by me: 2 fields with MLCs for custom blocks to protect heart, and lungs;  and, a Vac-lok. MORE COMPLEX DEVICES MAY BE MADE IN DOSIMETRY FOR FIELD IN FIELD BEAMS FOR DOSE HOMOGENEITY.  I have requested : 3D Simulation which is medically necessary to give adequate dose to at risk tissues while sparing lungs and heart.  I have requested a DVH of the following structures: lungs, heart, left lumpectomy cavity.    The patient will receive 50.4 Gy in 28 fractions to the left breast with 2 tangential fields.  This will be followed by a boost.  Optical Surface Tracking Plan:  Since intensity modulated radiotherapy (IMRT) and 3D conformal radiation treatment methods are predicated on accurate and precise positioning for treatment, intrafraction motion monitoring is medically necessary to ensure accurate and safe treatment delivery. The ability to quantify intrafraction motion without excessive ionizing radiation dose can only be performed with optical surface tracking. Accordingly, surface imaging offers the opportunity to obtain 3D measurements of patient position throughout IMRT and 3D treatments without excessive radiation exposure. I am  ordering optical surface tracking for this patient's upcoming course of radiotherapy.  ________________________________   Reference:  Ursula Alert, J, et al. Surface imaging-based analysis of intrafraction motion for breast  radiotherapy patients.Journal of [place], n. 6, nov. 2014. ISSN [id number].  Available at: <http://www.jacmp.org/index.php/jacmp/article/view/4957>.   NOTE: Letter written today for patient as excuse from jury duty.  -----------------------------------  Eppie Gibson, MD

## 2017-07-13 DIAGNOSIS — C50412 Malignant neoplasm of upper-outer quadrant of left female breast: Secondary | ICD-10-CM | POA: Diagnosis not present

## 2017-07-18 ENCOUNTER — Ambulatory Visit: Admission: RE | Admit: 2017-07-18 | Payer: PRIVATE HEALTH INSURANCE | Source: Ambulatory Visit

## 2017-07-18 ENCOUNTER — Ambulatory Visit
Admission: RE | Admit: 2017-07-18 | Discharge: 2017-07-18 | Disposition: A | Discharge status: Home / Self Care | Payer: PRIVATE HEALTH INSURANCE | Source: Ambulatory Visit | Attending: Radiation Oncology | Admitting: Radiation Oncology

## 2017-07-18 DIAGNOSIS — Z791 Long term (current) use of non-steroidal anti-inflammatories (NSAID): Secondary | ICD-10-CM | POA: Insufficient documentation

## 2017-07-18 DIAGNOSIS — Z79899 Other long term (current) drug therapy: Secondary | ICD-10-CM | POA: Diagnosis not present

## 2017-07-18 DIAGNOSIS — Z510 Encounter for antineoplastic radiation therapy: Secondary | ICD-10-CM | POA: Diagnosis not present

## 2017-07-18 DIAGNOSIS — C50412 Malignant neoplasm of upper-outer quadrant of left female breast: Secondary | ICD-10-CM | POA: Insufficient documentation

## 2017-07-18 DIAGNOSIS — Z8249 Family history of ischemic heart disease and other diseases of the circulatory system: Secondary | ICD-10-CM | POA: Insufficient documentation

## 2017-07-18 DIAGNOSIS — F419 Anxiety disorder, unspecified: Secondary | ICD-10-CM | POA: Insufficient documentation

## 2017-07-18 DIAGNOSIS — Z79891 Long term (current) use of opiate analgesic: Secondary | ICD-10-CM | POA: Insufficient documentation

## 2017-07-18 DIAGNOSIS — Z8489 Family history of other specified conditions: Secondary | ICD-10-CM | POA: Diagnosis not present

## 2017-07-18 DIAGNOSIS — Z9889 Other specified postprocedural states: Secondary | ICD-10-CM | POA: Insufficient documentation

## 2017-07-18 DIAGNOSIS — Z170 Estrogen receptor positive status [ER+]: Secondary | ICD-10-CM | POA: Diagnosis not present

## 2017-07-19 ENCOUNTER — Other Ambulatory Visit: Payer: Self-pay | Admitting: Internal Medicine

## 2017-07-19 ENCOUNTER — Ambulatory Visit
Admission: RE | Admit: 2017-07-19 | Discharge: 2017-07-19 | Disposition: A | Discharge status: Home / Self Care | Payer: PRIVATE HEALTH INSURANCE | Source: Ambulatory Visit | Attending: Radiation Oncology | Admitting: Radiation Oncology

## 2017-07-19 DIAGNOSIS — Z7952 Long term (current) use of systemic steroids: Secondary | ICD-10-CM | POA: Diagnosis not present

## 2017-07-19 DIAGNOSIS — Z79899 Other long term (current) drug therapy: Secondary | ICD-10-CM | POA: Insufficient documentation

## 2017-07-19 DIAGNOSIS — C50412 Malignant neoplasm of upper-outer quadrant of left female breast: Secondary | ICD-10-CM | POA: Insufficient documentation

## 2017-07-19 DIAGNOSIS — Z8249 Family history of ischemic heart disease and other diseases of the circulatory system: Secondary | ICD-10-CM | POA: Insufficient documentation

## 2017-07-19 DIAGNOSIS — Z8489 Family history of other specified conditions: Secondary | ICD-10-CM | POA: Diagnosis not present

## 2017-07-19 DIAGNOSIS — Z170 Estrogen receptor positive status [ER+]: Secondary | ICD-10-CM | POA: Insufficient documentation

## 2017-07-19 DIAGNOSIS — Z9882 Breast implant status: Secondary | ICD-10-CM | POA: Insufficient documentation

## 2017-07-19 DIAGNOSIS — Z9889 Other specified postprocedural states: Secondary | ICD-10-CM | POA: Insufficient documentation

## 2017-07-19 DIAGNOSIS — Z791 Long term (current) use of non-steroidal anti-inflammatories (NSAID): Secondary | ICD-10-CM | POA: Diagnosis not present

## 2017-07-19 MED ORDER — RADIAPLEXRX EX GEL
Freq: Once | CUTANEOUS | Status: AC
Start: 2017-07-19 — End: 2017-07-19
  Administered 2017-07-19: 09:00:00 via TOPICAL

## 2017-07-19 MED ORDER — ALRA NON-METALLIC DEODORANT (RAD-ONC)
1.0000 | Freq: Once | TOPICAL | Status: AC
Start: 2017-07-19 — End: 2017-07-19
  Administered 2017-07-19: 1 via TOPICAL

## 2017-07-19 NOTE — Progress Notes (Signed)

## 2017-07-19 NOTE — Telephone Encounter (Signed)
Pt undergoing radiation treatment. Refill Ambien and see if she needs Macrobid. That Rx  for Macrobid was for a trip.

## 2017-07-20 ENCOUNTER — Ambulatory Visit
Admission: RE | Admit: 2017-07-20 | Discharge: 2017-07-20 | Disposition: A | Discharge status: Home / Self Care | Payer: PRIVATE HEALTH INSURANCE | Source: Ambulatory Visit | Attending: Radiation Oncology | Admitting: Radiation Oncology

## 2017-07-20 DIAGNOSIS — C50412 Malignant neoplasm of upper-outer quadrant of left female breast: Secondary | ICD-10-CM | POA: Diagnosis not present

## 2017-07-20 NOTE — Telephone Encounter (Signed)
Spoke with patient and she stated that she actually did NOT want the Ambien.  Said she was sorry, didn't mean to call on that one.  She actually does need the Macrobid.  And, she needs Valcyclovir.  Called these to [facility] @ [address] [place].

## 2017-07-21 ENCOUNTER — Ambulatory Visit
Admission: RE | Admit: 2017-07-21 | Discharge: 2017-07-21 | Disposition: A | Discharge status: Home / Self Care | Payer: PRIVATE HEALTH INSURANCE | Source: Ambulatory Visit | Attending: Radiation Oncology | Admitting: Radiation Oncology

## 2017-07-21 DIAGNOSIS — C50412 Malignant neoplasm of upper-outer quadrant of left female breast: Secondary | ICD-10-CM | POA: Diagnosis not present

## 2017-07-22 ENCOUNTER — Ambulatory Visit
Admission: RE | Admit: 2017-07-22 | Discharge: 2017-07-22 | Disposition: A | Discharge status: Home / Self Care | Payer: PRIVATE HEALTH INSURANCE | Source: Ambulatory Visit | Attending: Radiation Oncology | Admitting: Radiation Oncology

## 2017-07-22 DIAGNOSIS — C50412 Malignant neoplasm of upper-outer quadrant of left female breast: Secondary | ICD-10-CM | POA: Diagnosis not present

## 2017-07-25 ENCOUNTER — Ambulatory Visit: Payer: PRIVATE HEALTH INSURANCE

## 2017-07-26 ENCOUNTER — Ambulatory Visit
Admission: RE | Admit: 2017-07-26 | Discharge: 2017-07-26 | Disposition: A | Discharge status: Home / Self Care | Payer: PRIVATE HEALTH INSURANCE | Source: Ambulatory Visit | Attending: Radiation Oncology | Admitting: Radiation Oncology

## 2017-07-26 DIAGNOSIS — Z510 Encounter for antineoplastic radiation therapy: Secondary | ICD-10-CM | POA: Diagnosis not present

## 2017-07-27 ENCOUNTER — Ambulatory Visit
Admission: RE | Admit: 2017-07-27 | Discharge: 2017-07-27 | Disposition: A | Discharge status: Home / Self Care | Payer: PRIVATE HEALTH INSURANCE | Source: Ambulatory Visit | Attending: Radiation Oncology | Admitting: Radiation Oncology

## 2017-07-27 DIAGNOSIS — Z510 Encounter for antineoplastic radiation therapy: Secondary | ICD-10-CM | POA: Diagnosis not present

## 2017-07-28 ENCOUNTER — Ambulatory Visit
Admission: RE | Admit: 2017-07-28 | Discharge: 2017-07-28 | Disposition: A | Discharge status: Home / Self Care | Payer: PRIVATE HEALTH INSURANCE | Source: Ambulatory Visit | Attending: Radiation Oncology | Admitting: Radiation Oncology

## 2017-07-28 DIAGNOSIS — Z510 Encounter for antineoplastic radiation therapy: Secondary | ICD-10-CM | POA: Diagnosis not present

## 2017-07-29 ENCOUNTER — Ambulatory Visit
Admission: RE | Admit: 2017-07-29 | Discharge: 2017-07-29 | Disposition: A | Discharge status: Home / Self Care | Payer: PRIVATE HEALTH INSURANCE | Source: Ambulatory Visit | Attending: Radiation Oncology | Admitting: Radiation Oncology

## 2017-07-29 DIAGNOSIS — Z510 Encounter for antineoplastic radiation therapy: Secondary | ICD-10-CM | POA: Diagnosis not present

## 2017-07-30 ENCOUNTER — Ambulatory Visit
Admission: RE | Admit: 2017-07-30 | Discharge: 2017-07-30 | Disposition: A | Discharge status: Home / Self Care | Payer: PRIVATE HEALTH INSURANCE | Source: Ambulatory Visit | Attending: Radiation Oncology | Admitting: Radiation Oncology

## 2017-07-30 DIAGNOSIS — Z510 Encounter for antineoplastic radiation therapy: Secondary | ICD-10-CM | POA: Diagnosis not present

## 2017-08-01 ENCOUNTER — Ambulatory Visit
Admission: RE | Admit: 2017-08-01 | Discharge: 2017-08-01 | Disposition: A | Discharge status: Home / Self Care | Payer: PRIVATE HEALTH INSURANCE | Source: Ambulatory Visit | Attending: Radiation Oncology | Admitting: Radiation Oncology

## 2017-08-01 DIAGNOSIS — Z510 Encounter for antineoplastic radiation therapy: Secondary | ICD-10-CM | POA: Diagnosis not present

## 2017-08-02 ENCOUNTER — Ambulatory Visit
Admission: RE | Admit: 2017-08-02 | Discharge: 2017-08-02 | Disposition: A | Discharge status: Home / Self Care | Payer: PRIVATE HEALTH INSURANCE | Source: Ambulatory Visit | Attending: Radiation Oncology | Admitting: Radiation Oncology

## 2017-08-02 DIAGNOSIS — Z510 Encounter for antineoplastic radiation therapy: Secondary | ICD-10-CM | POA: Diagnosis not present

## 2017-08-03 ENCOUNTER — Ambulatory Visit
Admission: RE | Admit: 2017-08-03 | Discharge: 2017-08-03 | Disposition: A | Discharge status: Home / Self Care | Payer: PRIVATE HEALTH INSURANCE | Source: Ambulatory Visit | Attending: Radiation Oncology | Admitting: Radiation Oncology

## 2017-08-03 DIAGNOSIS — Z510 Encounter for antineoplastic radiation therapy: Secondary | ICD-10-CM | POA: Diagnosis not present

## 2017-08-04 ENCOUNTER — Ambulatory Visit
Admission: RE | Admit: 2017-08-04 | Discharge: 2017-08-04 | Disposition: A | Discharge status: Home / Self Care | Payer: PRIVATE HEALTH INSURANCE | Source: Ambulatory Visit | Attending: Radiation Oncology | Admitting: Radiation Oncology

## 2017-08-04 DIAGNOSIS — Z510 Encounter for antineoplastic radiation therapy: Secondary | ICD-10-CM | POA: Diagnosis not present

## 2017-08-05 ENCOUNTER — Ambulatory Visit
Admission: RE | Admit: 2017-08-05 | Discharge: 2017-08-05 | Disposition: A | Discharge status: Home / Self Care | Payer: PRIVATE HEALTH INSURANCE | Source: Ambulatory Visit | Attending: Radiation Oncology | Admitting: Radiation Oncology

## 2017-08-05 DIAGNOSIS — Z510 Encounter for antineoplastic radiation therapy: Secondary | ICD-10-CM | POA: Diagnosis not present

## 2017-08-08 ENCOUNTER — Ambulatory Visit
Admission: RE | Admit: 2017-08-08 | Discharge: 2017-08-08 | Disposition: A | Discharge status: Home / Self Care | Payer: PRIVATE HEALTH INSURANCE | Source: Ambulatory Visit | Attending: Radiation Oncology | Admitting: Radiation Oncology

## 2017-08-08 DIAGNOSIS — Z510 Encounter for antineoplastic radiation therapy: Secondary | ICD-10-CM | POA: Diagnosis not present

## 2017-08-10 ENCOUNTER — Ambulatory Visit
Admission: RE | Admit: 2017-08-10 | Discharge: 2017-08-10 | Disposition: A | Discharge status: Home / Self Care | Payer: PRIVATE HEALTH INSURANCE | Source: Ambulatory Visit | Attending: Radiation Oncology | Admitting: Radiation Oncology

## 2017-08-10 DIAGNOSIS — Z510 Encounter for antineoplastic radiation therapy: Secondary | ICD-10-CM | POA: Diagnosis not present

## 2017-08-11 ENCOUNTER — Ambulatory Visit
Admission: RE | Admit: 2017-08-11 | Discharge: 2017-08-11 | Disposition: A | Discharge status: Home / Self Care | Payer: PRIVATE HEALTH INSURANCE | Source: Ambulatory Visit | Attending: Radiation Oncology | Admitting: Radiation Oncology

## 2017-08-11 DIAGNOSIS — Z510 Encounter for antineoplastic radiation therapy: Secondary | ICD-10-CM | POA: Diagnosis not present

## 2017-08-12 ENCOUNTER — Telehealth: Payer: Self-pay | Admitting: Hematology and Oncology

## 2017-08-12 ENCOUNTER — Ambulatory Visit
Admission: RE | Admit: 2017-08-12 | Discharge: 2017-08-12 | Disposition: A | Discharge status: Home / Self Care | Payer: PRIVATE HEALTH INSURANCE | Source: Ambulatory Visit | Attending: Radiation Oncology | Admitting: Radiation Oncology

## 2017-08-12 DIAGNOSIS — Z510 Encounter for antineoplastic radiation therapy: Secondary | ICD-10-CM | POA: Diagnosis not present

## 2017-08-12 NOTE — Telephone Encounter (Signed)
Spoke with patient and scheduled appt with [place] [facility] [place]

## 2017-08-15 ENCOUNTER — Ambulatory Visit
Admission: RE | Admit: 2017-08-15 | Discharge: 2017-08-15 | Disposition: A | Discharge status: Home / Self Care | Payer: PRIVATE HEALTH INSURANCE | Source: Ambulatory Visit | Attending: Radiation Oncology | Admitting: Radiation Oncology

## 2017-08-15 DIAGNOSIS — Z510 Encounter for antineoplastic radiation therapy: Secondary | ICD-10-CM | POA: Diagnosis not present

## 2017-08-15 DIAGNOSIS — Z170 Estrogen receptor positive status [ER+]: Principal | ICD-10-CM

## 2017-08-15 DIAGNOSIS — C50412 Malignant neoplasm of upper-outer quadrant of left female breast: Secondary | ICD-10-CM

## 2017-08-15 MED ORDER — SONAFINE EX EMUL
1.0000 "application " | Freq: Once | CUTANEOUS | Status: AC
  Administered 2017-08-15: 1 via TOPICAL

## 2017-08-17 ENCOUNTER — Ambulatory Visit
Admission: RE | Admit: 2017-08-17 | Discharge: 2017-08-17 | Disposition: A | Discharge status: Home / Self Care | Payer: PRIVATE HEALTH INSURANCE | Source: Ambulatory Visit | Attending: Radiation Oncology | Admitting: Radiation Oncology

## 2017-08-17 DIAGNOSIS — Z510 Encounter for antineoplastic radiation therapy: Secondary | ICD-10-CM | POA: Diagnosis not present

## 2017-08-18 ENCOUNTER — Telehealth: Payer: Self-pay | Admitting: Hematology and Oncology

## 2017-08-18 ENCOUNTER — Ambulatory Visit
Admission: RE | Admit: 2017-08-18 | Discharge: 2017-08-18 | Disposition: A | Discharge status: Home / Self Care | Payer: PRIVATE HEALTH INSURANCE | Source: Ambulatory Visit | Attending: Radiation Oncology | Admitting: Radiation Oncology

## 2017-08-18 ENCOUNTER — Inpatient Hospital Stay: Payer: PRIVATE HEALTH INSURANCE | Attending: Hematology and Oncology | Admitting: Hematology and Oncology

## 2017-08-18 DIAGNOSIS — C50412 Malignant neoplasm of upper-outer quadrant of left female breast: Secondary | ICD-10-CM

## 2017-08-18 DIAGNOSIS — D0512 Intraductal carcinoma in situ of left breast: Secondary | ICD-10-CM | POA: Diagnosis not present

## 2017-08-18 DIAGNOSIS — Z170 Estrogen receptor positive status [ER+]: Secondary | ICD-10-CM

## 2017-08-18 DIAGNOSIS — Z510 Encounter for antineoplastic radiation therapy: Secondary | ICD-10-CM | POA: Diagnosis not present

## 2017-08-18 MED ORDER — TAMOXIFEN CITRATE 20 MG PO TABS: 20.0000 mg | ORAL_TABLET | Freq: Every day | ORAL | 3 refills | Status: DC

## 2017-08-18 NOTE — Progress Notes (Signed)
[facility] CONSULT NOTE  Patient Care Team: Baxley, Cresenciano Lick, MD as PCP - General (Internal Medicine)  CHIEF COMPLIANT: DCIS left breast  INTERVAL HISTORY: [name] is a 52 year old with above-mentioned history left breast DCIS treated with lumpectomy for high-grade DCIS.  She has been receiving adjuvant radiation therapy by Dr. Isidore Moos.  She still has 10 more days of radiation left.  She is here today to discuss the role of adjuvant antiestrogen therapy.  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  I reviewed her records extensively and collaborated the history with the patient.  SUMMARY OF ONCOLOGIC HISTORY:   Malignant neoplasm of upper-outer quadrant of left breast in female, estrogen receptor positive ([place])   05/26/2017 Surgery    Left lumpectomy: DCIS high-grade with necrosis and calcifications spanning 3.3 cm, margins negative, 0/1 lymph node negative, ER 30%, PR 0% Tis N0 stage 0      07/18/2017 - 08/18/2017 Radiation Therapy    Adjuvant radiation therapy with Dr. Isidore Moos      MEDICAL HISTORY:  Past Medical History:  Diagnosis Date  . Anxiety   . Breast mass 03/26/2017   3 oclock x 1 week   . Cancer ([place]) 04/2017   left breast cancer    SURGICAL HISTORY: Past Surgical History:  Procedure Laterality Date  . AUGMENTATION MAMMAPLASTY Bilateral 2012   [place]   . BREAST IMPLANT EXCHANGE Bilateral 06/03/2017   Procedure: REMOVAL BREAST IMPLANTS WITH REPLACEMENT RIGHT, LEFT BREAST IMPLANT PLACEMEMT;  Surgeon: Irene Limbo, MD;  Location: [facility];  Service: Plastics;  Laterality: Bilateral;  . BREAST IMPLANT REMOVAL Left 05/26/2017   Procedure: REMOVAL BREAST IMPLANT;  Surgeon: Rolm Bookbinder, MD;  Location: [facility];  Service: General;  Laterality: Left;  . BREAST LUMPECTOMY WITH RADIOACTIVE SEED AND SENTINEL LYMPH NODE BIOPSY Left 05/26/2017    Procedure: LEFT BREAST LUMPECTOMY WITH BRACKETED RADIOACTIVE SEED AND SENTINEL LYMPH NODE BIOPSY;  Surgeon: Rolm Bookbinder, MD;  Location: [facility];  Service: General;  Laterality: Left;  . BREAST SURGERY     implant Dec 2012    SOCIAL HISTORY: Social History   Socioeconomic History  . Marital status: Widowed    Spouse name: Not on file  . Number of children: Not on file  . Years of education: Not on file  . Highest education level: Not on file  Social Needs  . Financial resource strain: Not on file  . Food insecurity - worry: Not on file  . Food insecurity - inability: Not on file  . Transportation needs - medical: Not on file  . Transportation needs - non-medical: Not on file  Occupational History  . Not on file  Tobacco Use  . Smoking status: Never Smoker  . Smokeless tobacco: Never Used  Substance and Sexual Activity  . Alcohol use: Yes    Alcohol/week: 4.2 oz    Types: 7 Glasses of wine per week    Comment: social  . Drug use: No  . Sexual activity: Not Currently    Birth control/protection: Surgical    Comment: boyfriend has had vasectomy  Other Topics Concern  . Not on file  Social History Narrative  . Not on file    FAMILY HISTORY: Family History  Problem Relation Age of Onset  . Myelodysplastic syndrome Father   . Heart disease Maternal Grandmother   . Healthy Mother   .  Breast cancer Neg Hx     ALLERGIES:  has No Known Allergies.  MEDICATIONS:  Current Outpatient Medications  Medication Sig Dispense Refill  . cyclobenzaprine (FLEXERIL) 10 MG tablet Take 1 tablet (10 mg total) by mouth 3 (three) times daily as needed for muscle spasms. 30 tablet 0  . meloxicam (MOBIC) 15 MG tablet TAKE 1 TABLET BY MOUTH ONCE DAILY 90 tablet 0  . nitrofurantoin, macrocrystal-monohydrate, (MACROBID) 100 MG capsule     . oxyCODONE (OXY IR/ROXICODONE) 5 MG immediate release tablet Take 1 tablet (5 mg total) by mouth every 6 (six) hours as needed for  moderate pain, severe pain or breakthrough pain. (Patient not taking: Reported on 06/17/2017) 11 tablet 0  . valACYclovir (VALTREX) 500 MG tablet Take 1 tablet (500 mg total) by mouth 2 (two) times daily. (Patient not taking: Reported on 06/17/2017) 10 tablet 3   No current facility-administered medications for this visit.     REVIEW OF SYSTEMS:   Constitutional: Denies fevers, chills or abnormal night sweats Eyes: Denies blurriness of vision, double vision or watery eyes Ears, nose, mouth, throat, and face: Denies mucositis or sore throat Respiratory: Denies cough, dyspnea or wheezes Cardiovascular: Denies palpitation, chest discomfort or lower extremity swelling Gastrointestinal:  Denies nausea, heartburn or change in bowel habits Skin: Denies abnormal skin rashes Lymphatics: Denies new lymphadenopathy or easy bruising Neurological:Denies numbness, tingling or new weaknesses Behavioral/Psych: Mood is stable, no new changes  Breast: Currently undergoing radiation All other systems were reviewed with the patient and are negative.  PHYSICAL EXAMINATION: ECOG PERFORMANCE STATUS: 1 - Symptomatic but completely ambulatory  Vitals:   08/18/17 1041  BP: 109/68  Pulse: 62  Resp: 18  Temp: 98.3 F (36.8 C)  SpO2: 100%   Filed Weights   08/18/17 1041  Weight: 146 lb 3.2 oz (66.3 kg)    GENERAL:alert, no distress and comfortable SKIN: skin color, texture, turgor are normal, no rashes or significant lesions EYES: normal, conjunctiva are pink and non-injected, sclera clear OROPHARYNX:no exudate, no erythema and lips, buccal mucosa, and tongue normal  NECK: supple, thyroid normal size, non-tender, without nodularity LYMPH:  no palpable lymphadenopathy in the cervical, axillary or inguinal LUNGS: clear to auscultation and percussion with normal breathing effort HEART: regular rate & rhythm and no murmurs and no lower extremity edema ABDOMEN:abdomen soft, non-tender and normal bowel  sounds Musculoskeletal:no cyanosis of digits and no clubbing  PSYCH: alert & oriented x 3 with fluent speech NEURO: no focal motor/sensory deficits   LABORATORY DATA:  I have reviewed the data as listed Lab Results  Component Value Date   WBC 4.7 12/08/2015   HGB 14.3 12/08/2015   HCT 42.6 12/08/2015   MCV 91.6 12/08/2015   PLT 242 12/08/2015   Lab Results  Component Value Date   NA 143 12/08/2015   K 4.3 12/08/2015   CL 106 12/08/2015   CO2 27 12/08/2015    RADIOGRAPHIC STUDIES: I have personally reviewed the radiological reports and agreed with the findings in the report.  ASSESSMENT AND PLAN:  Malignant neoplasm of upper-outer quadrant of left breast in female, estrogen receptor positive ([place]) 05/26/2017: Left lumpectomy: DCIS high-grade with necrosis and calcifications spanning 3.3 cm, margins negative, 0/1 lymph node negative, ER 30%, PR 0% Tis N0 stage 0 07/18/2017 - adjuvant radiation therapy with Dr. Isidore Moos  Treatment plan: Adjuvant antiestrogen therapy with tamoxifen 20 mg daily times 5 years after radiation is complete most likely to start in February 2019  Tamoxifen counseling:  We discussed the risks and benefits of tamoxifen. These include but not limited to insomnia, hot flashes, mood changes, vaginal dryness, and weight gain. Although rare, serious side effects including endometrial cancer, risk of blood clots were also discussed. We strongly believe that the benefits far outweigh the risks. Patient understands these risks and consented to starting treatment. Planned treatment duration is 5 years. Patient is still premenopausal and hence she does not have any other options.  Return to clinic in 4 months for survivorship care plan visit  All questions were answered. The patient knows to call the clinic with any problems, questions or concerns.    Harriette Ohara, MD 08/18/17

## 2017-08-18 NOTE — Assessment & Plan Note (Signed)
05/26/2017: Left lumpectomy: DCIS high-grade with necrosis and calcifications spanning 3.3 cm, margins negative, 0/1 lymph node negative, ER 30%, PR 0% Tis N0 stage 0 07/18/2017 -08/18/2017 adjuvant radiation therapy with Dr. Isidore Moos  Treatment plan: Adjuvant antiestrogen therapy with tamoxifen 20 mg daily times 5 years  Tamoxifen counseling: We discussed the risks and benefits of tamoxifen. These include but not limited to insomnia, hot flashes, mood changes, vaginal dryness, and weight gain. Although rare, serious side effects including endometrial cancer, risk of blood clots were also discussed. We strongly believe that the benefits far outweigh the risks. Patient understands these risks and consented to starting treatment. Planned treatment duration is 5 years.  Return to clinic in 3 months for survivorship care plan visit

## 2017-08-18 NOTE — Telephone Encounter (Signed)
Patient scheduled per 1/3 los. Patient declined AVS and calendar of upcoming April appointments.

## 2017-08-19 ENCOUNTER — Ambulatory Visit
Admission: RE | Admit: 2017-08-19 | Discharge: 2017-08-19 | Disposition: A | Discharge status: Home / Self Care | Payer: PRIVATE HEALTH INSURANCE | Source: Ambulatory Visit | Attending: Radiation Oncology | Admitting: Radiation Oncology

## 2017-08-19 DIAGNOSIS — Z510 Encounter for antineoplastic radiation therapy: Secondary | ICD-10-CM | POA: Diagnosis not present

## 2017-08-22 ENCOUNTER — Ambulatory Visit
Admission: RE | Admit: 2017-08-22 | Discharge: 2017-08-22 | Disposition: A | Discharge status: Home / Self Care | Payer: PRIVATE HEALTH INSURANCE | Source: Ambulatory Visit | Attending: Radiation Oncology | Admitting: Radiation Oncology

## 2017-08-22 DIAGNOSIS — Z510 Encounter for antineoplastic radiation therapy: Secondary | ICD-10-CM | POA: Diagnosis not present

## 2017-08-23 ENCOUNTER — Ambulatory Visit
Admission: RE | Admit: 2017-08-23 | Discharge: 2017-08-23 | Disposition: A | Discharge status: Home / Self Care | Payer: PRIVATE HEALTH INSURANCE | Source: Ambulatory Visit | Attending: Radiation Oncology | Admitting: Radiation Oncology

## 2017-08-23 DIAGNOSIS — Z510 Encounter for antineoplastic radiation therapy: Secondary | ICD-10-CM | POA: Diagnosis not present

## 2017-08-24 ENCOUNTER — Ambulatory Visit
Admission: RE | Admit: 2017-08-24 | Discharge: 2017-08-24 | Disposition: A | Discharge status: Home / Self Care | Payer: PRIVATE HEALTH INSURANCE | Source: Ambulatory Visit | Attending: Radiation Oncology | Admitting: Radiation Oncology

## 2017-08-24 DIAGNOSIS — Z510 Encounter for antineoplastic radiation therapy: Secondary | ICD-10-CM | POA: Diagnosis not present

## 2017-08-25 ENCOUNTER — Ambulatory Visit
Admission: RE | Admit: 2017-08-25 | Discharge: 2017-08-25 | Disposition: A | Discharge status: Home / Self Care | Payer: PRIVATE HEALTH INSURANCE | Source: Ambulatory Visit | Attending: Radiation Oncology | Admitting: Radiation Oncology

## 2017-08-25 DIAGNOSIS — Z510 Encounter for antineoplastic radiation therapy: Secondary | ICD-10-CM | POA: Diagnosis not present

## 2017-08-26 ENCOUNTER — Ambulatory Visit
Admission: RE | Admit: 2017-08-26 | Discharge: 2017-08-26 | Disposition: A | Discharge status: Home / Self Care | Payer: PRIVATE HEALTH INSURANCE | Source: Ambulatory Visit | Attending: Radiation Oncology | Admitting: Radiation Oncology

## 2017-08-26 ENCOUNTER — Encounter: Payer: Self-pay | Admitting: Radiation Oncology

## 2017-08-26 DIAGNOSIS — C50412 Malignant neoplasm of upper-outer quadrant of left female breast: Secondary | ICD-10-CM

## 2017-08-26 DIAGNOSIS — Z170 Estrogen receptor positive status [ER+]: Principal | ICD-10-CM

## 2017-08-26 DIAGNOSIS — Z510 Encounter for antineoplastic radiation therapy: Secondary | ICD-10-CM | POA: Diagnosis not present

## 2017-08-29 ENCOUNTER — Ambulatory Visit
Admission: RE | Admit: 2017-08-29 | Discharge: 2017-08-29 | Disposition: A | Discharge status: Home / Self Care | Payer: PRIVATE HEALTH INSURANCE | Source: Ambulatory Visit | Attending: Radiation Oncology | Admitting: Radiation Oncology

## 2017-08-29 ENCOUNTER — Other Ambulatory Visit (HOSPITAL_COMMUNITY)
Admission: RE | Admit: 2017-08-29 | Discharge: 2017-08-29 | Disposition: A | Discharge status: Home / Self Care | Payer: PRIVATE HEALTH INSURANCE | Source: Ambulatory Visit | Attending: Obstetrics and Gynecology | Admitting: Obstetrics and Gynecology

## 2017-08-29 ENCOUNTER — Ambulatory Visit: Payer: PRIVATE HEALTH INSURANCE

## 2017-08-29 ENCOUNTER — Other Ambulatory Visit: Payer: Self-pay | Admitting: Obstetrics and Gynecology

## 2017-08-29 DIAGNOSIS — Z124 Encounter for screening for malignant neoplasm of cervix: Secondary | ICD-10-CM | POA: Insufficient documentation

## 2017-08-29 DIAGNOSIS — Z170 Estrogen receptor positive status [ER+]: Principal | ICD-10-CM

## 2017-08-29 DIAGNOSIS — Z510 Encounter for antineoplastic radiation therapy: Secondary | ICD-10-CM | POA: Diagnosis not present

## 2017-08-29 DIAGNOSIS — C50412 Malignant neoplasm of upper-outer quadrant of left female breast: Secondary | ICD-10-CM

## 2017-08-29 MED ORDER — SONAFINE EX EMUL
1.0000 "application " | Freq: Once | CUTANEOUS | Status: AC
  Administered 2017-08-29: 1 via TOPICAL

## 2017-08-30 ENCOUNTER — Ambulatory Visit: Payer: PRIVATE HEALTH INSURANCE

## 2017-08-30 ENCOUNTER — Ambulatory Visit
Admission: RE | Admit: 2017-08-30 | Discharge: 2017-08-30 | Disposition: A | Discharge status: Home / Self Care | Payer: PRIVATE HEALTH INSURANCE | Source: Ambulatory Visit | Attending: Radiation Oncology | Admitting: Radiation Oncology

## 2017-08-30 DIAGNOSIS — Z510 Encounter for antineoplastic radiation therapy: Secondary | ICD-10-CM | POA: Diagnosis not present

## 2017-08-30 LAB — CYTOLOGY - PAP
DIAGNOSIS: NEGATIVE
HPV (WINDOPATH): DETECTED — AB
HPV 16/18/45 GENOTYPING: POSITIVE — AB

## 2017-08-31 ENCOUNTER — Other Ambulatory Visit: Payer: Self-pay

## 2017-08-31 ENCOUNTER — Ambulatory Visit (INDEPENDENT_AMBULATORY_CARE_PROVIDER_SITE_OTHER): Payer: PRIVATE HEALTH INSURANCE | Admitting: Internal Medicine

## 2017-08-31 ENCOUNTER — Encounter: Payer: Self-pay | Admitting: Internal Medicine

## 2017-08-31 ENCOUNTER — Ambulatory Visit
Admission: RE | Admit: 2017-08-31 | Discharge: 2017-08-31 | Disposition: A | Discharge status: Home / Self Care | Payer: PRIVATE HEALTH INSURANCE | Source: Ambulatory Visit | Attending: Radiation Oncology | Admitting: Radiation Oncology

## 2017-08-31 DIAGNOSIS — Z23 Encounter for immunization: Secondary | ICD-10-CM | POA: Diagnosis not present

## 2017-08-31 DIAGNOSIS — Z510 Encounter for antineoplastic radiation therapy: Secondary | ICD-10-CM | POA: Diagnosis not present

## 2017-08-31 MED ORDER — OSELTAMIVIR PHOSPHATE 75 MG PO CAPS: 75.0000 mg | ORAL_CAPSULE | Freq: Two times a day (BID) | ORAL | 0 refills | Status: DC

## 2017-08-31 NOTE — Progress Notes (Signed)
Flu vaccine given. Call in Tamiflu to have for travel

## 2017-08-31 NOTE — Patient Instructions (Signed)
Flu vaccine given and Tamiflu Rx provided

## 2017-09-01 ENCOUNTER — Ambulatory Visit
Admission: RE | Admit: 2017-09-01 | Discharge: 2017-09-01 | Disposition: A | Discharge status: Home / Self Care | Payer: PRIVATE HEALTH INSURANCE | Source: Ambulatory Visit | Attending: Radiation Oncology | Admitting: Radiation Oncology

## 2017-09-01 DIAGNOSIS — Z510 Encounter for antineoplastic radiation therapy: Secondary | ICD-10-CM | POA: Diagnosis not present

## 2017-09-02 ENCOUNTER — Ambulatory Visit: Payer: PRIVATE HEALTH INSURANCE

## 2017-09-02 ENCOUNTER — Ambulatory Visit
Admission: RE | Admit: 2017-09-02 | Discharge: 2017-09-02 | Disposition: A | Discharge status: Home / Self Care | Payer: PRIVATE HEALTH INSURANCE | Source: Ambulatory Visit | Attending: Radiation Oncology | Admitting: Radiation Oncology

## 2017-09-02 DIAGNOSIS — Z510 Encounter for antineoplastic radiation therapy: Secondary | ICD-10-CM | POA: Diagnosis not present

## 2017-09-05 ENCOUNTER — Ambulatory Visit: Payer: PRIVATE HEALTH INSURANCE

## 2017-09-06 ENCOUNTER — Encounter: Payer: Self-pay | Admitting: Radiation Oncology

## 2017-09-06 NOTE — Progress Notes (Signed)
  Radiation Oncology         (336) [phone] ________________________________  Name: [name] MRN: [id number]  Date: 09/06/2017  DOB: 06-Apr-1966  End of Treatment Note  Diagnosis:    Stage 0 TisN0M0 LeftBreast UOQ Ductal Carcinoma In Situ, ER (40%+ weak staining) / PR (NEG) High Grade     Indication for treatment:  Curative       Radiation treatment dates:   07/18/17 - 09/02/17  Site/dose:   1) Left Breast / 50.4 Gy in 28 fractions 2) Left Breast Boost / 10 Gy in 5 fractions  Beams/energy:  1) tangents, 3D conformal  / 6MV 2) electrons /12MeV  Narrative: The patient tolerated radiation treatment relatively well.   She did endorse some erythema and dryness at her breast.  Plan: The patient has completed radiation treatment. The patient will return to radiation oncology clinic for routine followup in one month. I advised them to call or return sooner if they have any questions or concerns related to their recovery or treatment.  -----------------------------------  Eppie Gibson, MD  This document serves as a record of services personally performed by Eppie Gibson, MD. It was created on his behalf by Linward Natal, a trained medical scribe. The creation of this record is based on the scribe's personal observations and the provider's statements to them. This document has been checked and approved by the attending provider.

## 2017-09-09 ENCOUNTER — Other Ambulatory Visit: Payer: Self-pay | Admitting: Internal Medicine

## 2017-09-12 NOTE — Progress Notes (Signed)
Simulation verification outpatient   ICD-10-CM   1. Malignant neoplasm of upper-outer quadrant of left breast in female, estrogen receptor positive ([place]) C50.412    Z17.0     The patient was brought to the treatment machine and placed in the plan treatment position.  Clinical set up was verified to ensure that the target region is appropriately covered for the patient's upcoming electron boost treatment.  The targeted volume of tissue is appropriately covered by the radiation field.  Based on my personal review, I approve the simulation verification.  The patient's treatment will proceed as planned.  ------------------------------------  Eppie Gibson, MD

## 2017-09-30 ENCOUNTER — Encounter: Payer: Self-pay | Admitting: Radiation Oncology

## 2017-10-06 ENCOUNTER — Other Ambulatory Visit: Payer: Self-pay | Admitting: Obstetrics and Gynecology

## 2017-10-07 ENCOUNTER — Encounter: Payer: Self-pay | Admitting: Radiation Oncology

## 2017-10-07 ENCOUNTER — Ambulatory Visit
Admission: RE | Admit: 2017-10-07 | Discharge: 2017-10-07 | Disposition: A | Discharge status: Home / Self Care | Payer: PRIVATE HEALTH INSURANCE | Source: Ambulatory Visit | Attending: Radiation Oncology | Admitting: Radiation Oncology

## 2017-10-07 VITALS — BP 103/78 | HR 79 | Temp 98.5°F | Ht 66.0 in | Wt 145.0 lb

## 2017-10-07 DIAGNOSIS — Z170 Estrogen receptor positive status [ER+]: Secondary | ICD-10-CM | POA: Diagnosis not present

## 2017-10-07 DIAGNOSIS — Z79899 Other long term (current) drug therapy: Secondary | ICD-10-CM | POA: Insufficient documentation

## 2017-10-07 DIAGNOSIS — D0512 Intraductal carcinoma in situ of left breast: Secondary | ICD-10-CM | POA: Insufficient documentation

## 2017-10-07 DIAGNOSIS — C50412 Malignant neoplasm of upper-outer quadrant of left female breast: Secondary | ICD-10-CM

## 2017-10-07 DIAGNOSIS — Z923 Personal history of irradiation: Secondary | ICD-10-CM | POA: Diagnosis not present

## 2017-10-07 HISTORY — DX: Personal history of irradiation: Z92.3

## 2017-10-07 NOTE — Progress Notes (Signed)
Radiation Oncology         (336) [phone] ________________________________  Name: [name] MRN: [id number]  Date: 10/07/2017  DOB: July 28, 1966  Follow-Up Visit Note  Outpatient  CC: Elby Showers, MD  Rolm Bookbinder, MD  Diagnosis and Prior Radiotherapy:    ICD-10-CM   1. Malignant neoplasm of upper-outer quadrant of left breast in female, estrogen receptor positive ([place]) C50.412    Z17.0     Stage 0 TisN0M0 LeftBreast UOQ Ductal Carcinoma In Situ, ER (40%+ weak staining) / PR (NEG), High Grade   Radiation treatment dates:   07/18/2017 - 09/02/2017 Site/dose:   1) Left Breast / 50.4 Gy in 28 fractions 2) Left Breast Boost / 10 Gy in 5 fractions  CHIEF COMPLAINT: Here for follow-up and surveillance of left breast cancer  Narrative:  The patient returns today for routine follow-up of radiation completed one month ago to her left breast. She tolerated radiation treatment relatively well with some erythema and dryness to her breast. Skin is healing.  She is not on Tamoxifen, decided to decline this.                  ALLERGIES:  has No Known Allergies.  Meds: Current Outpatient Medications  Medication Sig Dispense Refill  . cyclobenzaprine (FLEXERIL) 10 MG tablet Take 1 tablet (10 mg total) by mouth 3 (three) times daily as needed for muscle spasms. 30 tablet 0  . meloxicam (MOBIC) 15 MG tablet TAKE 1 TABLET BY MOUTH ONCE DAILY 90 tablet 0  . nitrofurantoin, macrocrystal-monohydrate, (MACROBID) 100 MG capsule TAKE 1 CAPSULE BY MOUTH TWICE DAILY 20 capsule 0  . valACYclovir (VALTREX) 500 MG tablet Take 1 tablet (500 mg total) by mouth 2 (two) times daily. 10 tablet 3  . nitrofurantoin, macrocrystal-monohydrate, (MACROBID) 100 MG capsule     . tamoxifen (NOLVADEX) 20 MG tablet Take 1 tablet (20 mg total) by mouth daily. (Patient not taking: Reported on 10/07/2017) 90 tablet 3   No current facility-administered medications for this encounter.     Physical Findings: The  patient is in no acute distress. Patient is alert and oriented.  height is 5\' 6"  (1.676 m) and weight is 145 lb (65.8 kg). Her temperature is 98.5 F (36.9 C). Her blood pressure is 103/78 and her pulse is 79. Her oxygen saturation is 100%.    Satisfactory skin healing in radiotherapy fields. Mild hyperpigmentation remains.    Lab Findings: Lab Results  Component Value Date   WBC 4.7 12/08/2015   HGB 14.3 12/08/2015   HCT 42.6 12/08/2015   MCV 91.6 12/08/2015   PLT 242 12/08/2015    Radiographic Findings: No results found.  Impression/Plan: Healing well from radiotherapy to the breast tissue.  Continue skin care with topical Vitamin E oil and/or lotion for at least 2 more months for further healing.  I encouraged her to continue with yearly mammography as appropriate (for intact breast tissue) and followup with medical oncology. Encouraged self-breast exams monthly. I will see her back on an as-needed basis. I have encouraged her to call if she has any issues or concerns in the future. I wished her the very best.   _____________________________________   Eppie Gibson, MD  This document serves as a record of services personally performed by Eppie Gibson, MD. It was created on her behalf by Rae Lips, a trained medical scribe. The creation of this record is based on the scribe's personal observations and the provider's statements to them. This document  has been checked and approved by the attending provider.

## 2017-10-07 NOTE — Progress Notes (Signed)
Ms. Denardo presents for follow up of radiation completed 09/02/17 to her Left Breast. She denies pain. She has some fatigue. Her skin has healed well with some mild hyperpigmentation to underneath her Left Breast. She continues to use radiaplex daily. She will switch to a vitamin E cream when the radiaplex is completed. She has a survivorship appointment on 12/09/17. She has decided against taking Tamoxifen.   BP 103/78   Pulse 79   Temp 98.5 F (36.9 C)   Ht 5\' 6"  (1.676 m)   Wt 145 lb (65.8 kg)   SpO2 100% Comment: room air  BMI 23.40 kg/m    Wt Readings from Last 3 Encounters:  10/07/17 145 lb (65.8 kg)  08/18/17 146 lb 3.2 oz (66.3 kg)  06/17/17 144 lb 6.4 oz (65.5 kg)

## 2017-11-10 DIAGNOSIS — Z923 Personal history of irradiation: Secondary | ICD-10-CM | POA: Insufficient documentation

## 2017-11-10 DIAGNOSIS — Z86000 Personal history of in-situ neoplasm of breast: Secondary | ICD-10-CM | POA: Insufficient documentation

## 2017-11-30 ENCOUNTER — Telehealth: Payer: Self-pay

## 2017-11-30 NOTE — Telephone Encounter (Signed)
LVM for patient reminding of SCP visit on 12/09/17 at 10 am with NP.  Left center number for call back with questions.

## 2017-12-09 ENCOUNTER — Inpatient Hospital Stay: Payer: PRIVATE HEALTH INSURANCE | Attending: Adult Health | Admitting: Adult Health

## 2017-12-09 ENCOUNTER — Encounter: Payer: Self-pay | Admitting: Adult Health

## 2017-12-09 ENCOUNTER — Telehealth: Payer: Self-pay | Admitting: Hematology and Oncology

## 2017-12-09 VITALS — BP 116/68 | HR 62 | Temp 98.5°F | Resp 19 | Ht 66.0 in | Wt 141.0 lb

## 2017-12-09 DIAGNOSIS — C50412 Malignant neoplasm of upper-outer quadrant of left female breast: Secondary | ICD-10-CM | POA: Diagnosis not present

## 2017-12-09 DIAGNOSIS — Z923 Personal history of irradiation: Secondary | ICD-10-CM | POA: Diagnosis not present

## 2017-12-09 DIAGNOSIS — Z79810 Long term (current) use of selective estrogen receptor modulators (SERMs): Secondary | ICD-10-CM | POA: Insufficient documentation

## 2017-12-09 DIAGNOSIS — Z170 Estrogen receptor positive status [ER+]: Secondary | ICD-10-CM | POA: Diagnosis not present

## 2017-12-09 DIAGNOSIS — F419 Anxiety disorder, unspecified: Secondary | ICD-10-CM | POA: Diagnosis not present

## 2017-12-09 NOTE — Telephone Encounter (Signed)
Patient decline avs and calendar °

## 2017-12-09 NOTE — Progress Notes (Signed)
CLINIC:  Survivorship   REASON FOR VISIT:  Routine follow-up post-treatment for a recent history of breast cancer.  BRIEF ONCOLOGIC HISTORY:    Malignant neoplasm of upper-outer quadrant of left breast in female, estrogen receptor positive ([place])   05/26/2017 Surgery    Left lumpectomy: DCIS high-grade with necrosis and calcifications spanning 3.3 cm, margins negative, 0/1 lymph node negative, ER 30%, PR 0% Tis N0 stage 0      07/18/2017 - 08/18/2017 Radiation Therapy    Adjuvant radiation therapy with Dr. Isidore Moos      09/2017 -  Anti-estrogen oral therapy    Tamoxifen daily       INTERVAL HISTORY:  Ms. Crayton presents to the [facility] today for our initial meeting to review her survivorship care plan detailing her treatment course for breast cancer, as well as monitoring long-term side effects of that treatment, education regarding health maintenance, screening, and overall wellness and health promotion.     Overall, Ms. Yanda reports feeling quite well .  She has decided against taking Tamoxifen, after talking to several friends and her brother in law, Dr. Valere Dross.  She denies any issues today.      REVIEW OF SYSTEMS:  Review of Systems  Constitutional: Negative for appetite change, chills, fatigue and fever.  HENT:   Negative for hearing loss, lump/mass and trouble swallowing.   Eyes: Negative for eye problems and icterus.  Respiratory: Negative for chest tightness, cough and shortness of breath.   Cardiovascular: Negative for chest pain, leg swelling and palpitations.  Gastrointestinal: Negative for abdominal distention, abdominal pain, constipation, diarrhea, nausea and vomiting.  Endocrine: Negative for hot flashes.  Skin: Negative for itching and rash.  Neurological: Negative for dizziness, extremity weakness, headaches and numbness.  Hematological: Negative for adenopathy. Does not bruise/bleed easily.  Psychiatric/Behavioral: Negative for depression. The  patient is not nervous/anxious.   Breast: Denies any new nodularity, masses, tenderness, nipple changes, or nipple discharge.      ONCOLOGY TREATMENT TEAM:  1. Surgeon:  Dr. Donne Hazel at [facility] 2. Medical Oncologist: Dr. Lindi Adie  3. Radiation Oncologist: Dr. Isidore Moos    PAST MEDICAL/SURGICAL HISTORY:  Past Medical History:  Diagnosis Date  . Anxiety   . Breast mass 03/26/2017   3 oclock x 1 week   . Cancer ([place]) 04/2017   left breast cancer  . History of radiation therapy 07/18/17- 09/02/17   Left Breast 50.4 Gy in 28 fractions, Left Breast Boost 10 Gy in 5 fractions.    Past Surgical History:  Procedure Laterality Date  . AUGMENTATION MAMMAPLASTY Bilateral 2012   Saline   . BREAST IMPLANT EXCHANGE Bilateral 06/03/2017   Procedure: REMOVAL BREAST IMPLANTS WITH REPLACEMENT RIGHT, LEFT BREAST IMPLANT PLACEMEMT;  Surgeon: Irene Limbo, MD;  Location: [facility];  Service: Plastics;  Laterality: Bilateral;  . BREAST IMPLANT REMOVAL Left 05/26/2017   Procedure: REMOVAL BREAST IMPLANT;  Surgeon: Rolm Bookbinder, MD;  Location: [facility];  Service: General;  Laterality: Left;  . BREAST LUMPECTOMY WITH RADIOACTIVE SEED AND SENTINEL LYMPH NODE BIOPSY Left 05/26/2017   Procedure: LEFT BREAST LUMPECTOMY WITH BRACKETED RADIOACTIVE SEED AND SENTINEL LYMPH NODE BIOPSY;  Surgeon: Rolm Bookbinder, MD;  Location: [facility];  Service: General;  Laterality: Left;  . BREAST SURGERY     implant Dec 2012     ALLERGIES:  No Known Allergies   CURRENT MEDICATIONS:  Outpatient Encounter Medications as of 12/09/2017  Medication Sig  . cyclobenzaprine (FLEXERIL)  10 MG tablet Take 1 tablet (10 mg total) by mouth 3 (three) times daily as needed for muscle spasms.  . meloxicam (MOBIC) 15 MG tablet TAKE 1 TABLET BY MOUTH ONCE DAILY  . nitrofurantoin, macrocrystal-monohydrate, (MACROBID) 100 MG capsule   . nitrofurantoin,  macrocrystal-monohydrate, (MACROBID) 100 MG capsule TAKE 1 CAPSULE BY MOUTH TWICE DAILY  . valACYclovir (VALTREX) 500 MG tablet Take 1 tablet (500 mg total) by mouth 2 (two) times daily.  . tamoxifen (NOLVADEX) 20 MG tablet Take 1 tablet (20 mg total) by mouth daily. (Patient not taking: Reported on 10/07/2017)   No facility-administered encounter medications on file as of 12/09/2017.      ONCOLOGIC FAMILY HISTORY:  Family History  Problem Relation Age of Onset  . Myelodysplastic syndrome Father   . Heart disease Maternal Grandmother   . Healthy Mother   . Breast cancer Neg Hx      SOCIAL HISTORY:  Social History   Socioeconomic History  . Marital status: Widowed    Spouse name: Not on file  . Number of children: Not on file  . Years of education: Not on file  . Highest education level: Not on file  Occupational History  . Not on file  Social Needs  . Financial resource strain: Not on file  . Food insecurity:    Worry: Not on file    Inability: Not on file  . Transportation needs:    Medical: Not on file    Non-medical: Not on file  Tobacco Use  . Smoking status: Never Smoker  . Smokeless tobacco: Never Used  Substance and Sexual Activity  . Alcohol use: Yes    Alcohol/week: 4.2 oz    Types: 7 Glasses of wine per week    Comment: social  . Drug use: No  . Sexual activity: Not Currently    Birth control/protection: Surgical    Comment: boyfriend has had vasectomy  Lifestyle  . Physical activity:    Days per week: Not on file    Minutes per session: Not on file  . Stress: Not on file  Relationships  . Social connections:    Talks on phone: Not on file    Gets together: Not on file    Attends religious service: Not on file    Active member of club or organization: Not on file    Attends meetings of clubs or organizations: Not on file    Relationship status: Not on file  . Intimate partner violence:    Fear of current or ex partner: Not on file    Emotionally  abused: Not on file    Physically abused: Not on file    Forced sexual activity: Not on file  Other Topics Concern  . Not on file  Social History Narrative  . Not on file      PHYSICAL EXAMINATION:  Vital Signs:   Vitals:   12/09/17 1015  BP: 116/68  Pulse: 62  Resp: 19  Temp: 98.5 F (36.9 C)  SpO2: 100%   Filed Weights   12/09/17 1015  Weight: 141 lb (64 kg)   General: Well-nourished, well-appearing female in no acute distress.  She is unaccompanied today.   HEENT: Head is normocephalic.  Pupils equal and reactive to light. Conjunctivae clear without exudate.  Sclerae anicteric. Oral mucosa is pink, moist.  Oropharynx is pink without lesions or erythema.  Lymph: No cervical, supraclavicular, or infraclavicular lymphadenopathy noted on palpation.  Cardiovascular: Regular rate and rhythm.Marland Kitchen Respiratory: Clear  to auscultation bilaterally. Chest expansion symmetric; breathing non-labored.  Breasts: breast s/p implant placement, no nodularity noted.  Left breast without nodules, masses, right breast without nodules, masses, benign bilateral breast exam GI: Abdomen soft and round; non-tender, non-distended. Bowel sounds normoactive.  GU: Deferred.  Neuro: No focal deficits. Steady gait.  Psych: Mood and affect normal and appropriate for situation.  Extremities: No edema. MSK: No focal spinal tenderness to palpation.  Full range of motion in bilateral upper extremities Skin: Warm and dry.  LABORATORY DATA:  None for this visit.  DIAGNOSTIC IMAGING:  None for this visit.      ASSESSMENT AND PLAN:  Ms.. Gravelle is a pleasant 52 y.o. female with Stage 0 left breast DCIS, ER+/PR-/HER2-, diagnosed in 05/2017, treated with lumpectomy, adjuvant radiation therapy, and anti-estrogen therapy with Tamoxifen beginning in 09/2017.  She presents to the Survivorship Clinic for our initial meeting and routine follow-up post-completion of treatment for breast cancer.    1. Stage 0 left  breast cancer:  Ms. Pendell is continuing to recover from definitive treatment for breast cancer. She will follow-up with her medical oncologist, Dr. Lindi Adie in 6 months with history and physical exam per surveillance protocol.  I let her know today that he may decide to have surgery or her gyn alone follow her since she is not taking the tamoxifen. Today, a comprehensive survivorship care plan and treatment summary was reviewed with the patient today detailing her breast cancer diagnosis, treatment course, potential late/long-term effects of treatment, appropriate follow-up care with recommendations for the future, and patient education resources.  A copy of this summary, along with a letter will be sent to the patient's primary care provider via mail/fax/In Basket message after today's visit.    2. Bone health:  Given Ms. Lubin's age/history of breast cancer, she is at risk for bone demineralization. She was given education on specific activities to promote bone health.  3. Cancer screening:  Due to Ms. Arenson's history and her age, she should receive screening for skin cancers, colon cancer, and gynecologic cancers.  The information and recommendations are listed on the patient's comprehensive care plan/treatment summary and were reviewed in detail with the patient.    4. Health maintenance and wellness promotion: Ms. Kirshner was encouraged to consume 5-7 servings of fruits and vegetables per day. We reviewed the "Nutrition Rainbow" handout, as well as the handout "Take Control of Your Health and Reduce Your Cancer Risk" from the [employer].  She was also encouraged to engage in moderate to vigorous exercise for 30 minutes per day most days of the week. We discussed the [employer] fitness program, which is designed for cancer survivors to help them become more physically fit after cancer treatments.  She was instructed to limit her alcohol consumption and continue to abstain from tobacco  use.     5. Support services/counseling: It is not uncommon for this period of the patient's cancer care trajectory to be one of many emotions and stressors.  We discussed an opportunity for her to participate in the next session of [facility] ("Finding Your New Normal") support group series designed for patients after they have completed treatment.   Ms. Mcenaney was encouraged to take advantage of our many other support services programs, support groups, and/or counseling in coping with her new life as a cancer survivor after completing anti-cancer treatment.  S  She was given information regarding our available services and encouraged to contact me with any questions or for  help enrolling in any of our support group/programs.    Dispo:   -Return to cancer center in 6 months for f/u with Dr. Lindi Adie  -Mammogram due in 03/2018 -She is welcome to return back to the Survivorship Clinic at any time; no additional follow-up needed at this time.  -Consider referral back to survivorship as a long-term survivor for continued surveillance  A total of (30) minutes of face-to-face time was spent with this patient with greater than 50% of that time in counseling and care-coordination.   Gardenia Phlegm, NP Survivorship Program [facility] [phone]   Note: PRIMARY CARE PROVIDER Elby Showers, [place] [phone]

## 2017-12-10 ENCOUNTER — Encounter: Payer: Self-pay | Admitting: Adult Health

## 2017-12-22 ENCOUNTER — Encounter: Payer: PRIVATE HEALTH INSURANCE | Admitting: Adult Health

## 2017-12-24 IMAGING — MG 2D DIGITAL DIAGNOSTIC UNILATERAL LEFT MAMMOGRAM WITH IMPLANTS, C
8 of 12 series · 8 of 24 positions shown · non-contrast
Comparison: Previous exam(s).

CLINICAL DATA: 51-year-old patient underwent lumpectomy for DCIS of
the left breast in May 2017. At the time of lumpectomy she also
had reconstruction with removal of her previous breast implant and
placement of a new breast implant. Mammogram is requested prior to
radiation to exclude residual calcifications.

EXAM:
2D DIGITAL DIAGNOSTIC LEFT MAMMOGRAM WITH IMPLANTS, CAD AND ADJUNCT
TOMO
The patient has retropectoral implants. Standard and implant
displaced views were performed.

[L CC (1 of 3)]
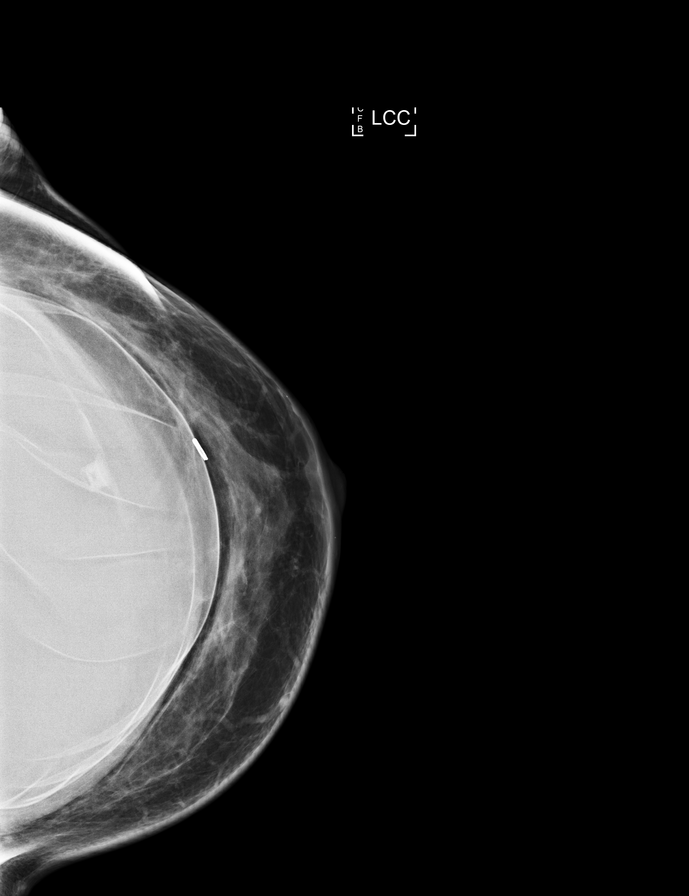

[L MLO (1 of 2)]
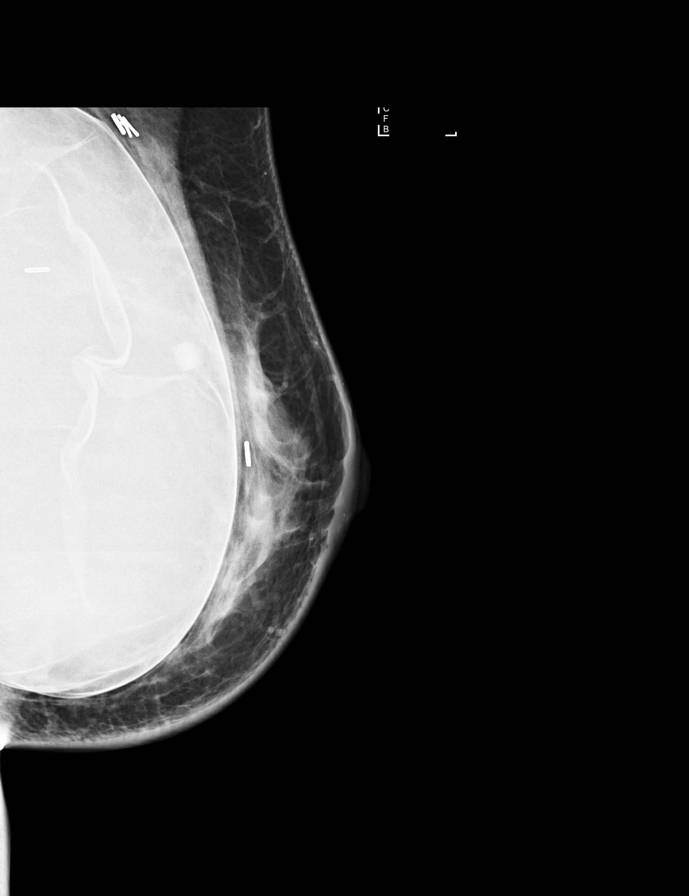

[L CC (2 of 3)]
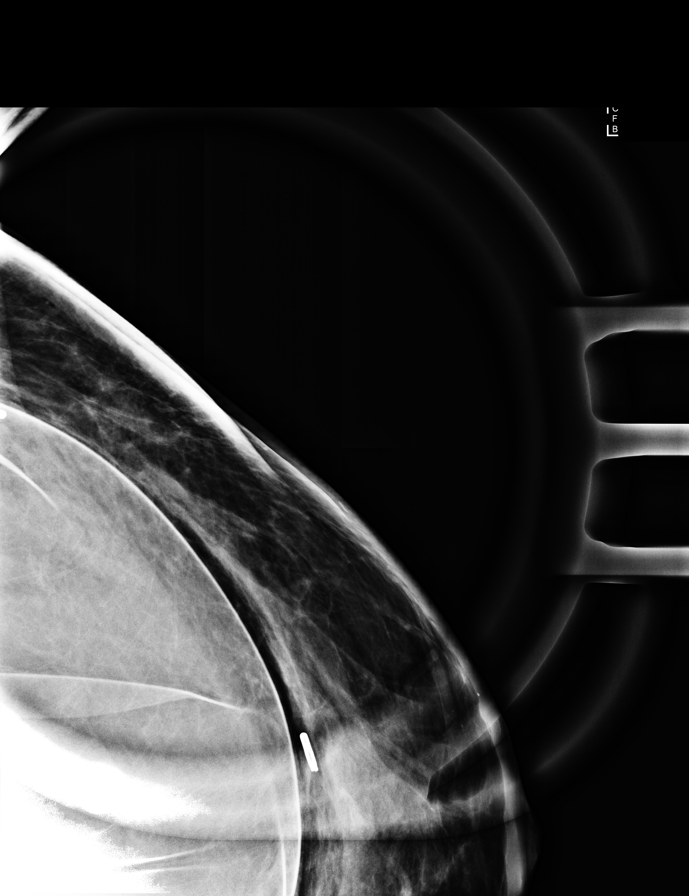

[L CC (3 of 3)]
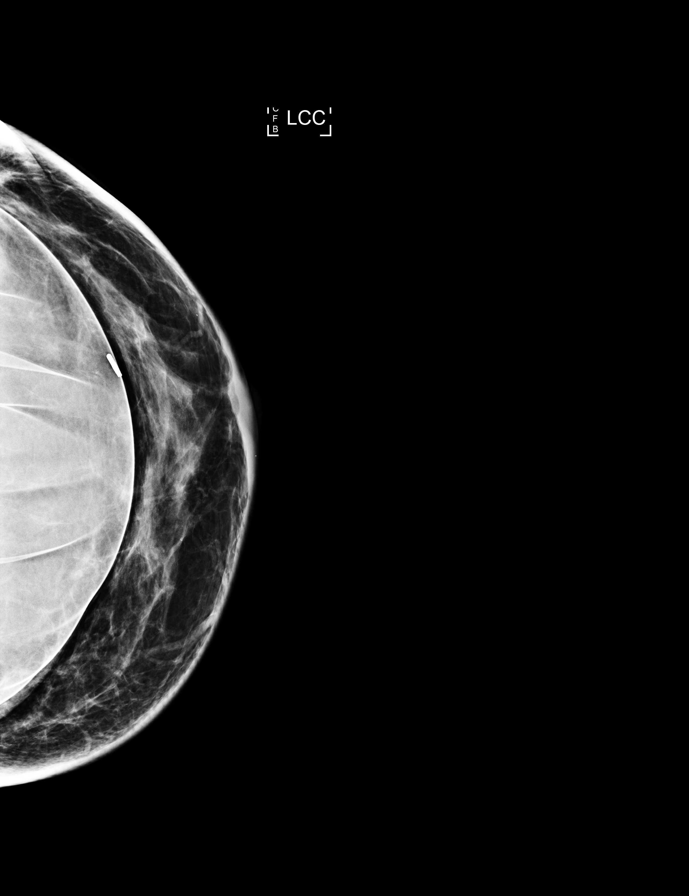

[L CC synth-2D]
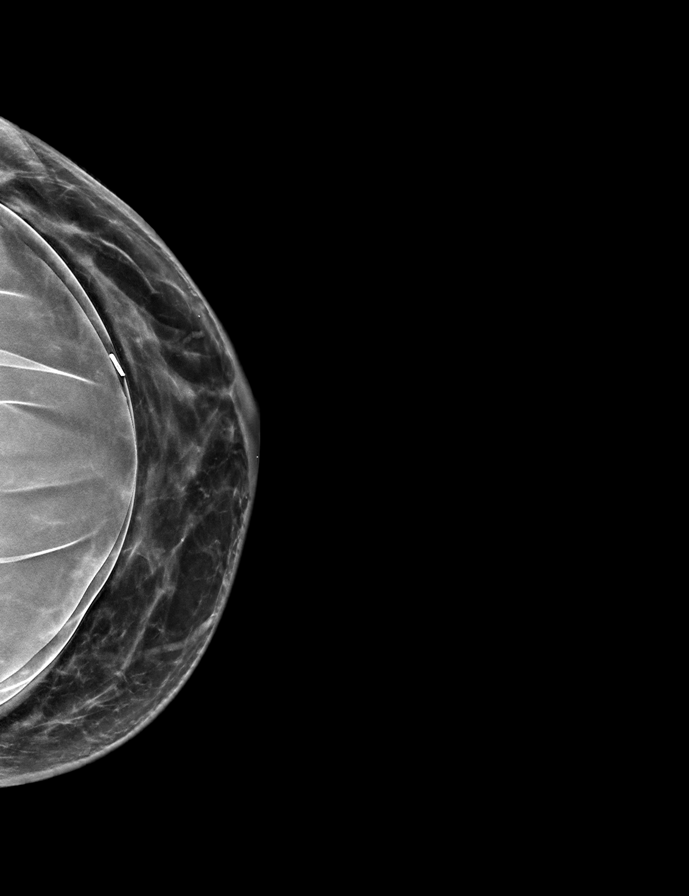

[L MLO (2 of 2)]
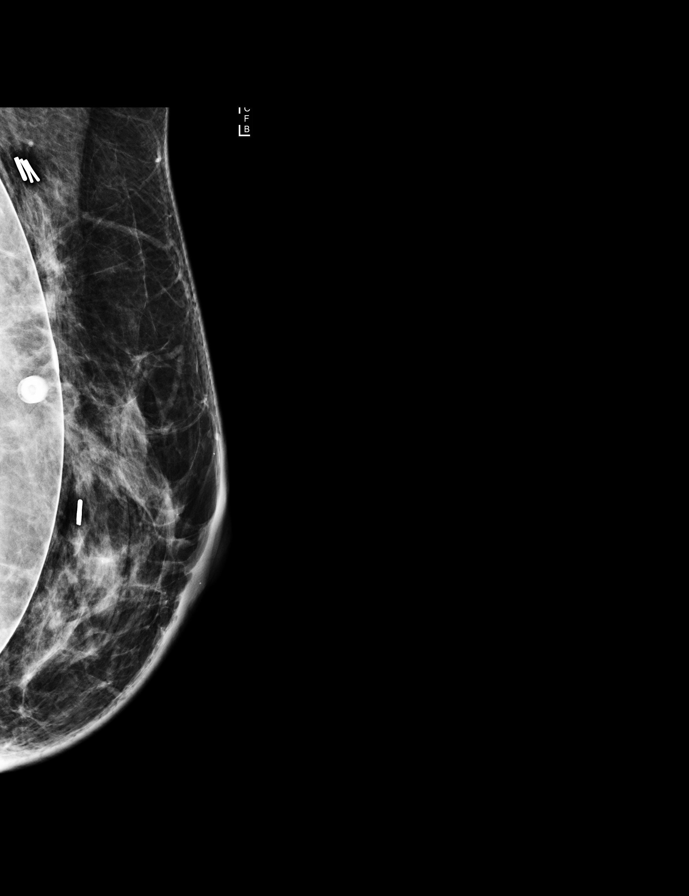

[L XCCL synth-2D]
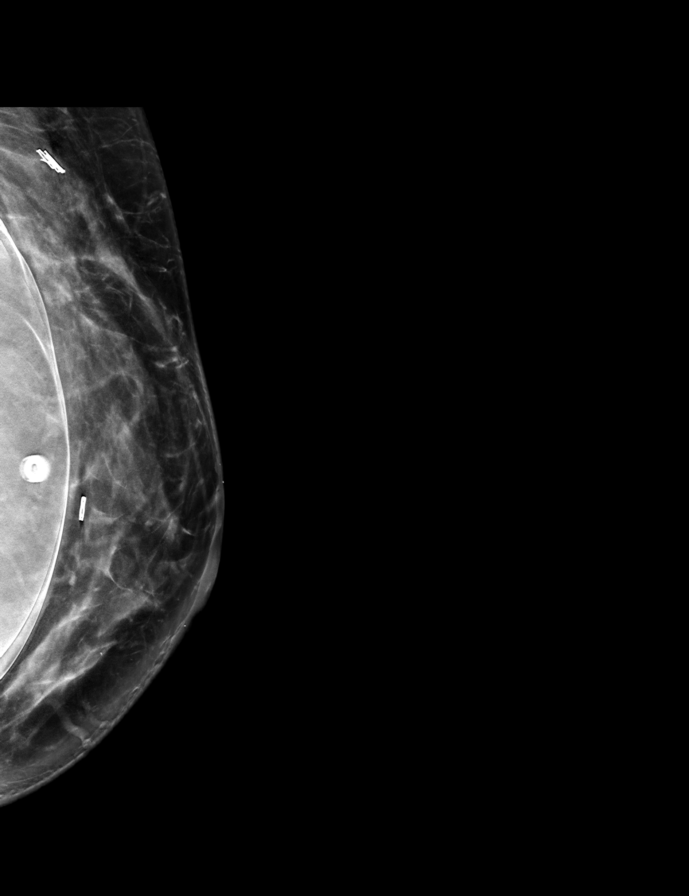

[L MLO synth-2D]
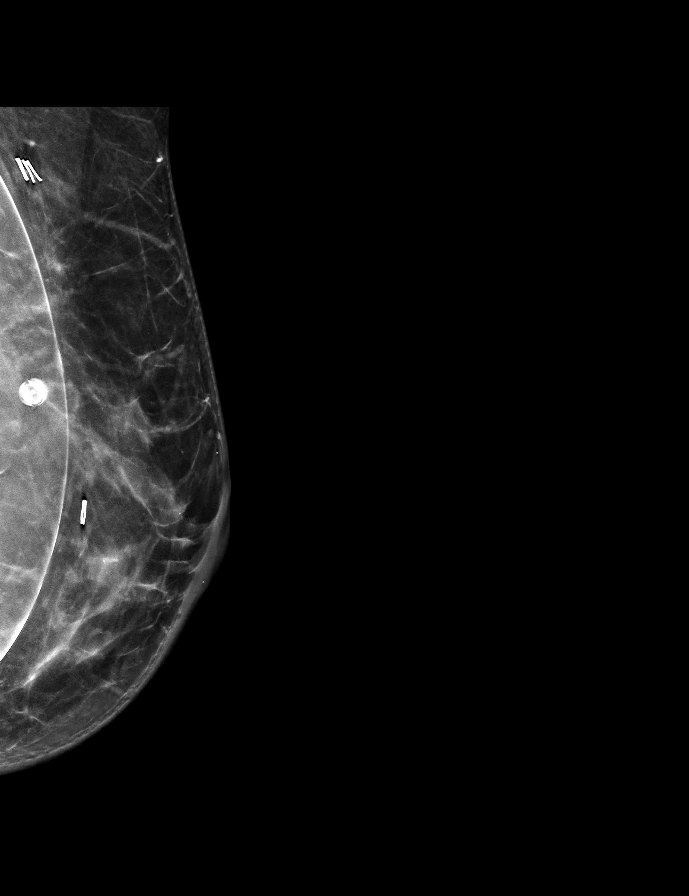

[8 of 24 positions shown; findings below may reference images not displayed]

ACR Breast Density Category c: The breast tissue is heterogeneously
dense, which may obscure small masses.
FINDINGS: There are surgical clips and lumpectomy changes in the deep outer
left breast. There is also a single surgical clip in the central
left breast. Exaggerated lateral view is performed to obtain as much
lateral tissue as possible. Magnification view of the lumpectomy
site is performed. No residual suspicious microcalcifications are
identified. A few scattered benign skin calcifications are noted.
Saline breast implant is present.

Mammographic images were processed with CAD.
IMPRESSION: Lumpectomy changes on the left. Negative for residual
microcalcifications. No suspicious findings.

RECOMMENDATION:
Bilateral diagnostic mammogram is recommended in March 2018.

I have discussed the findings and recommendations with the patient.
Results were also provided in writing at the conclusion of the
visit. If applicable, a reminder letter will be sent to the patient
regarding the next appointment.

BI-RADS CATEGORY  2: Benign.

## 2018-01-05 ENCOUNTER — Other Ambulatory Visit: Payer: Self-pay

## 2018-01-05 ENCOUNTER — Ambulatory Visit: Payer: PRIVATE HEALTH INSURANCE | Attending: General Surgery | Admitting: Physical Therapy

## 2018-01-05 ENCOUNTER — Encounter: Payer: Self-pay | Admitting: Physical Therapy

## 2018-01-05 DIAGNOSIS — R293 Abnormal posture: Secondary | ICD-10-CM

## 2018-01-05 DIAGNOSIS — L599 Disorder of the skin and subcutaneous tissue related to radiation, unspecified: Secondary | ICD-10-CM | POA: Insufficient documentation

## 2018-01-05 NOTE — Therapy (Signed)
[place], [place], [address] Phone: [phone]   Fax:  [phone]  Physical Therapy Evaluation  Patient Details  Name: [name] MRN: [id number] Date of Birth: 13-Dec-1965 Referring Provider: Donne Hazel   Encounter Date: 01/05/2018  PT End of Session - 01/05/18 1510    Visit Number  1    Number of Visits  5    Date for PT Re-Evaluation  02/02/18    PT Start Time  6222 pt arrived late    PT Stop Time  1510    PT Time Calculation (min)  33 min    Activity Tolerance  Patient tolerated treatment well    Behavior During Therapy  [facility] for tasks assessed/performed       Past Medical History:  Diagnosis Date  . Anxiety   . Breast mass 03/26/2017   3 oclock x 1 week   . Cancer ([place]) 04/2017   left breast cancer  . History of radiation therapy 07/18/17- 09/02/17   Left Breast 50.4 Gy in 28 fractions, Left Breast Boost 10 Gy in 5 fractions.     Past Surgical History:  Procedure Laterality Date  . AUGMENTATION MAMMAPLASTY Bilateral 2012   Saline   . BREAST IMPLANT EXCHANGE Bilateral 06/03/2017   Procedure: REMOVAL BREAST IMPLANTS WITH REPLACEMENT RIGHT, LEFT BREAST IMPLANT PLACEMEMT;  Surgeon: Irene Limbo, MD;  Location: [place];  Service: Plastics;  Laterality: Bilateral;  . BREAST IMPLANT REMOVAL Left 05/26/2017   Procedure: REMOVAL BREAST IMPLANT;  Surgeon: Rolm Bookbinder, MD;  Location: [place];  Service: General;  Laterality: Left;  . BREAST LUMPECTOMY WITH RADIOACTIVE SEED AND SENTINEL LYMPH NODE BIOPSY Left 05/26/2017   Procedure: LEFT BREAST LUMPECTOMY WITH BRACKETED RADIOACTIVE SEED AND SENTINEL LYMPH NODE BIOPSY;  Surgeon: Rolm Bookbinder, MD;  Location: [place];  Service: General;  Laterality: Left;  . BREAST SURGERY     implant Dec 2012    There were no vitals filed for this visit.   Subjective Assessment - 01/05/18  1444    Subjective  I feel like the tightness in my left armpit is getting better. I was referred 3 weeks ago and then I went on vacation and it has been improving.     Pertinent History  Stage 0 left breast DCIS, ER+/PR-/HER2-, diagnosed in 05/2017, treated with Left lumpectomy on 05/26/17 DCIS high-grade with necrosis and calcifications spanning 3.3 cm, margins negative, 0/1 lymph node negative, ER 30%, PR 0% Tis N0 stage 0 , completed adjuvant radiation therapy    Patient Stated Goals  decrease tightness and get full ROM In left shoulder    Currently in Wolf?  No/denies         [facility] PT Assessment - 01/05/18 0001      Assessment   Medical Diagnosis  left breast cancer    Referring Provider  Donne Hazel    Onset Date/Surgical Date  05/26/17    Hand Dominance  Right    Prior Therapy  none      Precautions   Precautions  Other (comment)    Precaution Comments  at risk for lymphedema      Restrictions   Weight Bearing Restrictions  No      Balance Screen   Has the patient fallen in the past 6 months  No    Has the patient had a decrease in activity level because of a fear of falling?   No  Is the patient reluctant to leave their home because of a fear of falling?   No      Home Environment   Living Environment  Private residence    Living Arrangements  Alone    Available Help at Discharge  Family    Type of [place] to enter    Entrance Stairs-Number of Steps  [place]  Two level;Able to live on main level with bedroom/bathroom      Prior Function   Level of Independence  Independent    Vocation  Full time employment setting empty capules to [place] Requirements  lots of flying- mostly within [place]    Leisure  [employer] daily 30-45 min      Cognition   Overall Cognitive Status  Within Functional Limits for tasks assessed      ROM / Strength   AROM / PROM / Strength  AROM;Strength      AROM   AROM  Assessment Site  Shoulder    Right/Left Shoulder  Right;Left    Right Shoulder Flexion  160 Degrees    Right Shoulder ABduction  164 Degrees    Right Shoulder Internal Rotation  70 Degrees    Right Shoulder External Rotation  72 Degrees    Left Shoulder Flexion  165 Degrees    Left Shoulder ABduction  164 Degrees    Left Shoulder Internal Rotation  67 Degrees    Left Shoulder External Rotation  82 Degrees      Strength   Overall Strength  Within functional limits for tasks performed        LYMPHEDEMA/ONCOLOGY QUESTIONNAIRE - 01/05/18 1455      Type   Cancer Type  left breast cancer      Surgeries   Lumpectomy Date  05/26/17    Saline Implant Reconstruction Date  06/02/17    Sentinel Lymph Node Biopsy Date  05/26/17    Number Lymph Nodes Removed  1      Treatment   Active Chemotherapy Treatment  No    Past Chemotherapy Treatment  No    Active Radiation Treatment  No    Past Radiation Treatment  Yes      What other symptoms do you have   Are you Having Heaviness or Tightness  Yes    Are you having Wolf  Yes inferior axilla    Are you having pitting edema  No    Is it Hard or Difficult finding clothes that fit  No    Do you have infections  No    Is there Decreased scar mobility  No      Lymphedema Assessments   Lymphedema Assessments  Upper extremities      Right Upper Extremity Lymphedema   15 cm Proximal to Olecranon Process  25.2 cm    Olecranon Process  22.5 cm    15 cm Proximal to Ulnar Styloid Process  21.9 cm    Just Proximal to Ulnar Styloid Process  14.5 cm    Across Hand at [employer]  18.2 cm    At [place] of 2nd Digit  6.1 cm      Left Upper Extremity Lymphedema   15 cm Proximal to Olecranon Process  26 cm    Olecranon Process  22.4 cm    15 cm Proximal to Ulnar Styloid Process  21 cm    Just  Proximal to Ulnar Styloid Process  14.2 cm    Across Hand at [employer]  18.2 cm    At [place] of 2nd Digit  6 cm             Objective  measurements completed on examination: See above findings.              PT Education - 01/05/18 1516    Education provided  Yes    Education Details  lymphedema risk reduction practices, ABC class    Person(s) Educated  Patient    Methods  Explanation;Handout    Comprehension  Verbalized understanding          PT Long Term Goals - 01/05/18 1515      PT LONG TERM GOAL #1   Title  Pt will be independent with Strength ABC program for continued strengthening and stretching    Time  4    Period  Weeks    Status  New    Target Date  02/02/18      PT LONG TERM GOAL #2   Title  Pt will be able to independently verbalize lymphedema risk reduction practices    Time  4    Period  Weeks    Status  New    Target Date  02/02/18      PT LONG TERM GOAL #3   Title  Pt will report a 75% improvement in tightness and Wolf in left axilla to improve comfort.     Time  4    Period  Weeks    Status  New    Target Date  02/02/18             Plan - 01/05/18 1511    Clinical Impression Statement  Pt presents to PT with left axillary tightness and discomfort following a left lumpectomy and SLNB (1 node) on May 26, 2017 for treatment of left DCIS breast cancer. Pt has completed radiation and did not require chemotherapy. Her strength and ROM are [facility] but she is having tightness in her left axilla and would benefit from myofacial release and stretching to this area to decrease discomfort. She would also benefit from instruction in strength ABC program because she would like to get back to exercising and it would be a good way to transition her to this without increasing her risk of lymphedema. The program would also be easy for her to progress at home.     History and Personal Factors relevant to plan of care:  none    Clinical Presentation  Stable    Clinical Decision Making  Low    Rehab Potential  Good    Clinical Impairments Affecting Rehab Potential  hx of radiation    PT  Frequency  1x / week    PT Duration  4 weeks    PT Treatment/Interventions  ADLs/Self Care Home Management;Therapeutic exercise;Therapeutic activities;Patient/family education;Scar mobilization;Manual techniques;Passive range of motion;Taping    PT Next Visit Plan  myofascial and STM to left axilla to decrease tightness, see if pt has signed up for ABC class, instruct in strength ABC program    Consulted and Agree with Plan of Care  Patient       Patient will benefit from skilled therapeutic intervention in order to improve the following deficits and impairments:  Wolf, Increased fascial restricitons, Decreased knowledge of precautions, Postural dysfunction  Visit Diagnosis: Disorder of the skin and subcutaneous tissue related to radiation, unspecified  Abnormal posture  Problem List Patient Active Problem List   Diagnosis Date Noted  . Malignant neoplasm of upper-outer quadrant of left breast in female, estrogen receptor positive ([place]) 06/08/2017    Allyson Sabal [facility] 01/05/2018, 3:16 PM  [place], [place], [address] Phone: [phone]   Fax:  [phone]  Name: [name] MRN: [id number] Date of Birth: [birth year]  Manus Gunning, PT 01/05/18 3:18 PM

## 2018-04-07 ENCOUNTER — Ambulatory Visit
Admission: RE | Admit: 2018-04-07 | Discharge: 2018-04-07 | Disposition: A | Discharge status: Home / Self Care | Payer: PRIVATE HEALTH INSURANCE | Source: Ambulatory Visit | Attending: Adult Health | Admitting: Adult Health

## 2018-04-07 DIAGNOSIS — Z170 Estrogen receptor positive status [ER+]: Principal | ICD-10-CM

## 2018-04-07 DIAGNOSIS — C50412 Malignant neoplasm of upper-outer quadrant of left female breast: Secondary | ICD-10-CM

## 2018-04-07 HISTORY — DX: Personal history of irradiation: Z92.3

## 2018-04-07 HISTORY — DX: Malignant neoplasm of unspecified site of unspecified female breast: C50.919

## 2018-06-09 ENCOUNTER — Inpatient Hospital Stay: Payer: PRIVATE HEALTH INSURANCE | Attending: Hematology and Oncology | Admitting: Hematology and Oncology

## 2018-06-09 ENCOUNTER — Other Ambulatory Visit: Payer: Self-pay

## 2018-06-09 ENCOUNTER — Telehealth: Payer: Self-pay | Admitting: Hematology and Oncology

## 2018-06-09 VITALS — BP 98/78 | HR 66 | Temp 98.5°F | Resp 18 | Ht 66.0 in | Wt 143.3 lb

## 2018-06-09 DIAGNOSIS — Z79810 Long term (current) use of selective estrogen receptor modulators (SERMs): Secondary | ICD-10-CM | POA: Diagnosis not present

## 2018-06-09 DIAGNOSIS — C50412 Malignant neoplasm of upper-outer quadrant of left female breast: Secondary | ICD-10-CM | POA: Insufficient documentation

## 2018-06-09 DIAGNOSIS — Z170 Estrogen receptor positive status [ER+]: Secondary | ICD-10-CM | POA: Diagnosis not present

## 2018-06-09 DIAGNOSIS — Z923 Personal history of irradiation: Secondary | ICD-10-CM | POA: Insufficient documentation

## 2018-06-09 DIAGNOSIS — R232 Flushing: Secondary | ICD-10-CM | POA: Insufficient documentation

## 2018-06-09 DIAGNOSIS — Z23 Encounter for immunization: Secondary | ICD-10-CM

## 2018-06-09 DIAGNOSIS — Z79899 Other long term (current) drug therapy: Secondary | ICD-10-CM | POA: Insufficient documentation

## 2018-06-09 MED ORDER — INFLUENZA VAC SPLIT QUAD 0.5 ML IM SUSY: 0.5000 mL | PREFILLED_SYRINGE | Freq: Once | INTRAMUSCULAR | Status: DC

## 2018-06-09 MED ORDER — INFLUENZA VAC SPLIT QUAD 0.5 ML IM SUSY
PREFILLED_SYRINGE | INTRAMUSCULAR | Status: AC
  Filled 2018-06-09: qty 0.5

## 2018-06-09 MED ORDER — INFLUENZA VAC SPLIT QUAD 0.5 ML IM SUSY
0.5000 mL | PREFILLED_SYRINGE | Freq: Once | INTRAMUSCULAR | Status: AC
  Administered 2018-06-09: 0.5 mL via INTRAMUSCULAR

## 2018-06-09 NOTE — Assessment & Plan Note (Signed)
05/26/2017: Left lumpectomy: DCIS high-grade with necrosis and calcifications spanning 3.3 cm, margins negative, 0/1 lymph node negative, ER 30%, PR 0% Tis N0 stage 0 07/18/2017 - adjuvant radiation therapy with Dr. Isidore Moos  Treatment plan: Adjuvant antiestrogen therapy with tamoxifen 20 mg daily times 5 years after radiation is complete most likely to start in February 2019  Tamoxifen toxicities:  Breast cancer surveillance: 1.  Breast exam April 2019: Benign 2. mammogram 04/07/2018: No evidence of malignancy, breast density category C  Return to clinic in 1 year for follow-up

## 2018-06-09 NOTE — Telephone Encounter (Signed)
Mailed pt calendar of upcoming appts

## 2018-06-09 NOTE — Progress Notes (Signed)
Patient Care Team: Elby Showers, MD as PCP - General (Internal Medicine) [name], MD as Consulting Physician (Hematology and Oncology) Rolm Bookbinder, MD as Consulting Physician (General Surgery) Eppie Gibson, MD as Attending Physician (Radiation Oncology) Gardenia Phlegm, NP as Nurse Practitioner (Hematology and Oncology) Irene Limbo, MD as Consulting Physician (Plastic Surgery)  DIAGNOSIS:  Encounter Diagnoses  Name Primary?  . Malignant neoplasm of upper-outer quadrant of left breast in female, estrogen receptor positive ([place])   . Need for prophylactic vaccination and inoculation against influenza Yes    SUMMARY OF ONCOLOGIC HISTORY:   Malignant neoplasm of upper-outer quadrant of left breast in female, estrogen receptor positive ([place])   05/26/2017 Surgery    Left lumpectomy: DCIS high-grade with necrosis and calcifications spanning 3.3 cm, margins negative, 0/1 lymph node negative, ER 30%, PR 0% Tis N0 stage 0    07/18/2017 - 08/18/2017 Radiation Therapy    Adjuvant radiation therapy with Dr. Isidore Moos    09/2017 -  Anti-estrogen oral therapy    Tamoxifen daily     CHIEF COMPLIANT: Follow-up on tamoxifen therapy  INTERVAL HISTORY: [name] is a 52 year old with above-mentioned history of left breast cancer who underwent lumpectomy and radiation is currently on tamoxifen since February 2019.  She appears to be tolerating tamoxifen fairly well.  She has occasional hot flashes but denies any night sweats.  Denies any arthralgias or myalgias.  REVIEW OF SYSTEMS:   Constitutional: Denies fevers, chills or abnormal weight loss Eyes: Denies blurriness of vision Ears, nose, mouth, throat, and face: Denies mucositis or sore throat Respiratory: Denies cough, dyspnea or wheezes Cardiovascular: Denies palpitation, chest discomfort Gastrointestinal:  Denies nausea, heartburn or change in bowel habits Skin: Denies abnormal skin rashes Lymphatics: Denies  new lymphadenopathy or easy bruising Neurological:Denies numbness, tingling or new weaknesses Behavioral/Psych: Mood is stable, no new changes  Extremities: No lower extremity edema Breast:  denies any pain or lumps or nodules in either breasts All other systems were reviewed with the patient and are negative.  I have reviewed the past medical history, past surgical history, social history and family history with the patient and they are unchanged from previous note.  ALLERGIES:  has No Known Allergies.  MEDICATIONS:  Current Outpatient Medications  Medication Sig Dispense Refill  . cyclobenzaprine (FLEXERIL) 10 MG tablet Take 1 tablet (10 mg total) by mouth 3 (three) times daily as needed for muscle spasms. 30 tablet 0  . meloxicam (MOBIC) 15 MG tablet TAKE 1 TABLET BY MOUTH ONCE DAILY 90 tablet 0  . nitrofurantoin, macrocrystal-monohydrate, (MACROBID) 100 MG capsule     . nitrofurantoin, macrocrystal-monohydrate, (MACROBID) 100 MG capsule TAKE 1 CAPSULE BY MOUTH TWICE DAILY 20 capsule 0  . tamoxifen (NOLVADEX) 20 MG tablet Take 1 tablet (20 mg total) by mouth daily. (Patient not taking: Reported on 10/07/2017) 90 tablet 3  . valACYclovir (VALTREX) 500 MG tablet Take 1 tablet (500 mg total) by mouth 2 (two) times daily. 10 tablet 3   No current facility-administered medications for this visit.     PHYSICAL EXAMINATION: ECOG PERFORMANCE STATUS: 1 - Symptomatic but completely ambulatory  Vitals:   06/09/18 1129  BP: 98/78  Pulse: 66  Resp: 18  Temp: 98.5 F (36.9 C)  SpO2: 100%   Filed Weights   06/09/18 1129  Weight: 143 lb 4.8 oz (65 kg)    GENERAL:alert, no distress and comfortable SKIN: skin color, texture, turgor are normal, no rashes or significant lesions EYES: normal, Conjunctiva  are pink and non-injected, sclera clear OROPHARYNX:no exudate, no erythema and lips, buccal mucosa, and tongue normal  NECK: supple, thyroid normal size, non-tender, without  nodularity LYMPH:  no palpable lymphadenopathy in the cervical, axillary or inguinal LUNGS: clear to auscultation and percussion with normal breathing effort HEART: regular rate & rhythm and no murmurs and no lower extremity edema ABDOMEN:abdomen soft, non-tender and normal bowel sounds MUSCULOSKELETAL:no cyanosis of digits and no clubbing  NEURO: alert & oriented x 3 with fluent speech, no focal motor/sensory deficits EXTREMITIES: No lower extremity edema BREAST: No palpable masses or nodules in either right or left breasts. No palpable axillary supraclavicular or infraclavicular adenopathy no breast tenderness or nipple discharge. (exam performed in the presence of a chaperone)  LABORATORY DATA:  I have reviewed the data as listed CMP Latest Ref Rng & Units 12/08/2015  Glucose 65 - 99 mg/dL 83  BUN 7 - 25 mg/dL 10  Creatinine 0.50 - 1.05 mg/dL 0.76  Sodium 135 - 146 mmol/L 143  Potassium 3.5 - 5.3 mmol/L 4.3  Chloride 98 - 110 mmol/L 106  CO2 20 - 31 mmol/L 27  Calcium 8.6 - 10.4 mg/dL 9.4  Total Protein 6.1 - 8.1 g/dL 6.5  Total Bilirubin 0.2 - 1.2 mg/dL 0.8  Alkaline Phos 33 - 130 U/L 75  AST 10 - 35 U/L 27  ALT 6 - 29 U/L 30(H)    Lab Results  Component Value Date   WBC 4.7 12/08/2015   HGB 14.3 12/08/2015   HCT 42.6 12/08/2015   MCV 91.6 12/08/2015   PLT 242 12/08/2015   NEUTROABS 2,115 12/08/2015    ASSESSMENT & PLAN:  Malignant neoplasm of upper-outer quadrant of left breast in female, estrogen receptor positive ([place]) 05/26/2017: Left lumpectomy: DCIS high-grade with necrosis and calcifications spanning 3.3 cm, margins negative, 0/1 lymph node negative, ER 30%, PR 0% Tis N0 stage 0 07/18/2017 - adjuvant radiation therapy with Dr. Isidore Moos  Treatment plan: Adjuvant antiestrogen therapy with tamoxifen 20 mg daily times 5 years after radiation is complete most likely to start in February 2019  Tamoxifen toxicities: Denies any side effects from tamoxifen.  Denies any  hot flashes or arthralgias or myalgias  Breast cancer surveillance: 1.  Breast exam April 2019: Benign 2. mammogram 04/07/2018: No evidence of malignancy, breast density category C  Return to clinic in 1 year for follow-up    No orders of the defined types were placed in this encounter.  The patient has a good understanding of the overall plan. she agrees with it. she will call with any problems that may develop before the next visit here.   Harriette Ohara, MD 06/12/18

## 2018-08-17 ENCOUNTER — Ambulatory Visit: Payer: PRIVATE HEALTH INSURANCE | Admitting: Internal Medicine

## 2018-08-17 ENCOUNTER — Encounter: Payer: Self-pay | Admitting: Internal Medicine

## 2018-08-17 VITALS — Ht 66.0 in

## 2018-08-17 DIAGNOSIS — N342 Other urethritis: Secondary | ICD-10-CM | POA: Diagnosis not present

## 2018-08-17 DIAGNOSIS — R300 Dysuria: Secondary | ICD-10-CM | POA: Diagnosis not present

## 2018-08-17 DIAGNOSIS — Z853 Personal history of malignant neoplasm of breast: Secondary | ICD-10-CM | POA: Diagnosis not present

## 2018-08-17 DIAGNOSIS — F411 Generalized anxiety disorder: Secondary | ICD-10-CM | POA: Diagnosis not present

## 2018-08-17 LAB — POCT URINALYSIS DIPSTICK
Appearance: NEGATIVE
BILIRUBIN UA: NEGATIVE
Blood, UA: NEGATIVE
Glucose, UA: NEGATIVE
KETONES UA: NEGATIVE
Leukocytes, UA: NEGATIVE
Nitrite, UA: NEGATIVE
Odor: NEGATIVE
PROTEIN UA: NEGATIVE
SPEC GRAV UA: 1.02 (ref 1.010–1.025)
UROBILINOGEN UA: 0.2 U/dL
pH, UA: 6 (ref 5.0–8.0)

## 2018-08-17 MED ORDER — CIPROFLOXACIN HCL 500 MG PO TABS: 500.0000 mg | ORAL_TABLET | Freq: Two times a day (BID) | ORAL | 0 refills | Status: DC

## 2018-08-30 NOTE — Progress Notes (Signed)
   Subjective:    Patient ID: [name], female    DOB: Jul 23, 1966, 53 y.o.   MRN: [id number]  HPI Patient has had some issues recently with urinary frequency and mild dysuria.  Travels with her job.  No fever or chills.  No back pain.  There is significant discomfort that she sought medical attention today.    Review of Systems no chills fever nausea or vomiting     Objective:   Physical Exam  No CVA tenderness, urine dipstick is negative but patient is symptomatic      Assessment & Plan:  Urethritis  History of breast cancer  Anxiety state-worried that this can turn into a full-blown bladder infection.  She lost her husband due to a sudden massive MI.  Then, she developed breast cancer. She has dealt with all of this exceedingly well and continues working.  Plan: Cipro 500 mg twice daily for 7 days.  15 minutes spent with patient including evaluation of symptoms, reviewing urine dipstick and making decision regarding treatment.  Counsel regarding stress in life.

## 2018-09-09 NOTE — Patient Instructions (Signed)
Cipro 500 mg twice daily for 7 days

## 2018-09-18 ENCOUNTER — Other Ambulatory Visit (HOSPITAL_COMMUNITY)
Admission: RE | Admit: 2018-09-18 | Discharge: 2018-09-18 | Disposition: A | Discharge status: Home / Self Care | Payer: 59 | Source: Ambulatory Visit | Attending: Obstetrics and Gynecology | Admitting: Obstetrics and Gynecology

## 2018-09-18 ENCOUNTER — Other Ambulatory Visit: Payer: Self-pay | Admitting: Obstetrics and Gynecology

## 2018-09-18 DIAGNOSIS — Z01419 Encounter for gynecological examination (general) (routine) without abnormal findings: Secondary | ICD-10-CM | POA: Diagnosis not present

## 2018-09-20 LAB — CYTOLOGY - PAP
DIAGNOSIS: NEGATIVE
HPV: NOT DETECTED

## 2018-10-06 ENCOUNTER — Other Ambulatory Visit: Payer: 59 | Admitting: Internal Medicine

## 2018-10-06 DIAGNOSIS — Z0000 Encounter for general adult medical examination without abnormal findings: Secondary | ICD-10-CM

## 2018-10-06 DIAGNOSIS — E559 Vitamin D deficiency, unspecified: Secondary | ICD-10-CM

## 2018-10-07 LAB — CBC WITH DIFFERENTIAL/PLATELET
ABSOLUTE MONOCYTES: 289 {cells}/uL (ref 200–950)
BASOS PCT: 0.8 %
Basophils Absolute: 30 cells/uL (ref 0–200)
Eosinophils Absolute: 99 cells/uL (ref 15–500)
Eosinophils Relative: 2.6 %
HCT: 40.9 % (ref 35.0–45.0)
Hemoglobin: 14.5 g/dL (ref 11.7–15.5)
Lymphs Abs: 1421 cells/uL (ref 850–3900)
MCH: 32.1 pg (ref 27.0–33.0)
MCHC: 35.5 g/dL (ref 32.0–36.0)
MCV: 90.5 fL (ref 80.0–100.0)
MONOS PCT: 7.6 %
MPV: 10.2 fL (ref 7.5–12.5)
NEUTROS PCT: 51.6 %
Neutro Abs: 1961 cells/uL (ref 1500–7800)
PLATELETS: 209 10*3/uL (ref 140–400)
RBC: 4.52 10*6/uL (ref 3.80–5.10)
RDW: 12.3 % (ref 11.0–15.0)
TOTAL LYMPHOCYTE: 37.4 %
WBC: 3.8 10*3/uL (ref 3.8–10.8)

## 2018-10-07 LAB — COMPLETE METABOLIC PANEL WITH GFR
AG Ratio: 1.9 (calc) (ref 1.0–2.5)
ALT: 29 U/L (ref 6–29)
AST: 28 U/L (ref 10–35)
Albumin: 4.4 g/dL (ref 3.6–5.1)
Alkaline phosphatase (APISO): 95 U/L (ref 37–153)
BUN: 17 mg/dL (ref 7–25)
CALCIUM: 9.7 mg/dL (ref 8.6–10.4)
CHLORIDE: 107 mmol/L (ref 98–110)
CO2: 24 mmol/L (ref 20–32)
Creat: 0.79 mg/dL (ref 0.50–1.05)
GFR, EST AFRICAN AMERICAN: 99 mL/min/{1.73_m2} (ref >=60)
GFR, EST NON AFRICAN AMERICAN: 85 mL/min/{1.73_m2} (ref >=60)
Globulin: 2.3 g/dL (calc) (ref 1.9–3.7)
Glucose, Bld: 77 mg/dL (ref 65–99)
POTASSIUM: 4.4 mmol/L (ref 3.5–5.3)
Sodium: 142 mmol/L (ref 135–146)
TOTAL PROTEIN: 6.7 g/dL (ref 6.1–8.1)
Total Bilirubin: 0.8 mg/dL (ref 0.2–1.2)

## 2018-10-07 LAB — TSH: TSH: 1.24 m[IU]/L

## 2018-10-07 LAB — LIPID PANEL
Cholesterol: 192 mg/dL (ref <200)
HDL: 91 mg/dL (ref >=50)
LDL CHOLESTEROL (CALC): 87 mg/dL
NON-HDL CHOLESTEROL (CALC): 101 mg/dL (ref <130)
TRIGLYCERIDES: 62 mg/dL (ref <150)
Total CHOL/HDL Ratio: 2.1 (calc) (ref <5.0)

## 2018-10-07 LAB — VITAMIN D 25 HYDROXY (VIT D DEFICIENCY, FRACTURES): Vit D, 25-Hydroxy: 57 ng/mL (ref 30–100)

## 2018-10-13 ENCOUNTER — Ambulatory Visit (INDEPENDENT_AMBULATORY_CARE_PROVIDER_SITE_OTHER): Payer: 59 | Admitting: Internal Medicine

## 2018-10-13 ENCOUNTER — Encounter: Payer: Self-pay | Admitting: Internal Medicine

## 2018-10-13 VITALS — BP 90/60 | HR 80 | Ht 66.0 in | Wt 142.0 lb

## 2018-10-13 DIAGNOSIS — Z86000 Personal history of in-situ neoplasm of breast: Secondary | ICD-10-CM | POA: Diagnosis not present

## 2018-10-13 DIAGNOSIS — Z0000 Encounter for general adult medical examination without abnormal findings: Secondary | ICD-10-CM | POA: Diagnosis not present

## 2018-10-13 DIAGNOSIS — Z23 Encounter for immunization: Secondary | ICD-10-CM | POA: Diagnosis not present

## 2018-10-13 LAB — POCT URINALYSIS DIPSTICK
Appearance: NEGATIVE
Bilirubin, UA: NEGATIVE
Glucose, UA: NEGATIVE
Ketones, UA: NEGATIVE
LEUKOCYTES UA: NEGATIVE
NITRITE UA: NEGATIVE
Odor: NEGATIVE
PROTEIN UA: NEGATIVE
RBC UA: NEGATIVE
SPEC GRAV UA: 1.01 (ref 1.010–1.025)
Urobilinogen, UA: 0.2 E.U./dL
pH, UA: 6.5 (ref 5.0–8.0)

## 2018-10-13 NOTE — Progress Notes (Signed)
Subjective:    Patient ID: [name], female    DOB: 11/07/1965, 53 y.o.   MRN: [id number]  HPI 53 year old Female for health maintenance exam and evaluation of medical issues.  In 12-03-2016 she underwent left breast lumpectomy for high-grade DCIS.  She received radiation therapy.  0 out of 1 lymph node negative.  ER 30%, PR 0.  Followed by Dr. Lindi Adie.  Breast implants were removed with a lumpectomy and subsequently she had replacement right and left breast implants by Dr. Iran Planas June 03, 2017  She has sclerotherapy at local dermatology office from time to time.   Social history: Social alcohol consumption.  2 sons, young adults, in good health.  Non-smoker.  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  Husband died of cardiac arrest 12/04/14.  Family history: Father died at age 9 due to myeloproliferative disorder.  Mother healthy.  2 sisters in excellent health.  GYN is Baxter Flattery Cole,MD.   Had bilateral breast implants 12-04-2010.  Took oral contraceptives until 12-04-15.  History of right ovarian cyst 12/03/00.  Also had multiple clear follicular cysts in both ovaries at that time.  In 2001-12-03, transvaginal ultrasound showed resolution of right ovarian cyst.  Pap was positive for HPV 16 in 2017-12-03 but HPV not detected in 12/04/2018 Pap.  Review of Systems  Constitutional: Negative.   Respiratory: Negative.   Cardiovascular: Negative.   Gastrointestinal: Negative.   Genitourinary: Negative.   Neurological: Negative.   Psychiatric/Behavioral: Negative.          Objective:   Physical Exam Vitals signs reviewed.  Constitutional:      General: She is not in acute distress.    Appearance: Normal appearance. She is not diaphoretic.  HENT:     Head: Normocephalic.     Right Ear: Tympanic membrane normal.     Left Ear: Tympanic membrane normal.     Nose: Nose normal.     Mouth/Throat:     Mouth: Mucous membranes are moist.     Pharynx: Oropharynx is clear. No  oropharyngeal exudate.  Eyes:     General: No scleral icterus.       Right eye: No discharge.        Left eye: No discharge.     Conjunctiva/sclera: Conjunctivae normal.     Pupils: Pupils are equal, round, and reactive to light.  Neck:     Musculoskeletal: Neck supple. No neck rigidity.  Cardiovascular:     Rate and Rhythm: Normal rate and regular rhythm.     Heart sounds: Normal heart sounds. No murmur.     Comments: Bilateral breast implants Pulmonary:     Effort: Pulmonary effort is normal. No respiratory distress.     Breath sounds: Normal breath sounds. No wheezing or rales.  Abdominal:     General: Bowel sounds are normal.     Palpations: Abdomen is soft.  Genitourinary:    Comments: Deferred to GYN Musculoskeletal:        General: No deformity.     Right lower leg: No edema.     Left lower leg: No edema.  Lymphadenopathy:     Cervical: No cervical adenopathy.  Skin:    General: Skin is warm and dry.     Findings: No rash.  Neurological:     General: No focal deficit present.     Mental Status: She is alert and oriented to person, place, and time.  Cranial Nerves: No cranial nerve deficit.     Sensory: No sensory deficit.     Motor: No weakness.     Coordination: Coordination normal.  Psychiatric:        Mood and Affect: Mood normal.        Behavior: Behavior normal.        Thought Content: Thought content normal.        Judgment: Judgment normal.           Assessment & Plan:  Impression: Normal health maintenance exam  History of left ductal carcinoma in situ status post lumpectomy and radiation therapy 2017  History of augmentation mammoplasty/breast reconstruction by Dr.Thimmappa after lumpectomy  Plan: Labs reviewed and are entirely within normal limits.  Tdap vaccine given.  Return in 1 year or as needed.

## 2018-10-13 NOTE — Patient Instructions (Signed)
It was a pleasure to see you today.  Return in 1 year or as needed. 

## 2019-01-09 ENCOUNTER — Encounter: Payer: Self-pay | Admitting: Internal Medicine

## 2019-01-09 ENCOUNTER — Ambulatory Visit: Payer: 59 | Admitting: Internal Medicine

## 2019-01-09 ENCOUNTER — Other Ambulatory Visit: Payer: Self-pay

## 2019-01-09 NOTE — Patient Instructions (Addendum)
Appt cancelled as pt arrived late for virtual appt. Rescheduled for another day.

## 2019-01-11 ENCOUNTER — Ambulatory Visit (INDEPENDENT_AMBULATORY_CARE_PROVIDER_SITE_OTHER): Payer: 59 | Admitting: Internal Medicine

## 2019-01-11 ENCOUNTER — Other Ambulatory Visit: Payer: Self-pay

## 2019-01-11 DIAGNOSIS — Z20822 Contact with and (suspected) exposure to covid-19: Secondary | ICD-10-CM

## 2019-01-11 DIAGNOSIS — R6889 Other general symptoms and signs: Secondary | ICD-10-CM

## 2019-01-12 ENCOUNTER — Ambulatory Visit: Payer: 59 | Admitting: Internal Medicine

## 2019-01-12 ENCOUNTER — Other Ambulatory Visit: Payer: Self-pay

## 2019-01-12 DIAGNOSIS — Z20822 Contact with and (suspected) exposure to covid-19: Secondary | ICD-10-CM

## 2019-01-12 LAB — SAR COV2 SEROLOGY (COVID19)AB(IGG),IA: SARS CoV2 AB IGG: NEGATIVE

## 2019-01-13 ENCOUNTER — Encounter: Payer: Self-pay | Admitting: Internal Medicine

## 2019-01-13 NOTE — Progress Notes (Addendum)
Covid 19 IgG test drawn. Pt had respiratory infection a few months back and requests testing. Hx of breast cancer. Pt travels a good deal for work.

## 2019-01-13 NOTE — Patient Instructions (Signed)
Covid antibody test is negative

## 2019-01-14 ENCOUNTER — Encounter: Payer: Self-pay | Admitting: Internal Medicine

## 2019-01-14 NOTE — Progress Notes (Signed)
   Subjective:    Patient ID: [name], female    DOB: [birth year], 53 y.o.   MRN: [id number]  HPI Due to the Coronavirus pandemic, patient seen by interactive audio and video telecommunications today.  She is agreeable to visit in this format today.  She is identified is Abbott Pao, a patient in this practice, using 2 identifiers.  Patient indicates that she travels with her job and that she had a protracted respiratory infection with bad cough earlier in the year.  She is now wondering if she could have had the Coronavirus and would like to have antibody testing.  Her son is moving to [place] very soon to accept a job in [place].  She indicates he would like to be tested as well.  He is not present on the visit today.  We will need his consent.    Review of Systems see above.  Currently has no issues with respiratory infection symptoms.  She has a history of breast cancer.     Objective:   Physical Exam  Not examined due to nature of visit which is virtual      Assessment & Plan:  Status post protracted respiratory infection-since she travels throughout the country it is plausible could have been exposed COVID-19 in early 2020.  Patient desires COVID-19 IgG antibody testing.  Patient will be tested tomorrow.

## 2019-01-14 NOTE — Patient Instructions (Signed)
Patient will have blood drawn for COVID-19 antibody testing tomorrow

## 2019-04-10 ENCOUNTER — Other Ambulatory Visit: Payer: Self-pay | Admitting: Adult Health

## 2019-04-10 DIAGNOSIS — Z853 Personal history of malignant neoplasm of breast: Secondary | ICD-10-CM

## 2019-04-18 ENCOUNTER — Other Ambulatory Visit: Payer: Self-pay

## 2019-04-18 ENCOUNTER — Ambulatory Visit
Admission: RE | Admit: 2019-04-18 | Discharge: 2019-04-18 | Disposition: A | Discharge status: Home / Self Care | Payer: 59 | Source: Ambulatory Visit | Attending: Adult Health | Admitting: Adult Health

## 2019-04-18 DIAGNOSIS — Z853 Personal history of malignant neoplasm of breast: Secondary | ICD-10-CM

## 2019-06-15 ENCOUNTER — Inpatient Hospital Stay: Payer: 59 | Admitting: Hematology and Oncology

## 2019-06-27 NOTE — Progress Notes (Signed)
Patient Care Team: Elby Showers, MD as PCP - General (Internal Medicine) [name], MD as Consulting Physician (Hematology and Oncology) Rolm Bookbinder, MD as Consulting Physician (General Surgery) Eppie Gibson, MD as Attending Physician (Radiation Oncology) Gardenia Phlegm, NP as Nurse Practitioner (Hematology and Oncology) Irene Limbo, MD as Consulting Physician (Plastic Surgery)  DIAGNOSIS:    ICD-10-CM   1. Malignant neoplasm of upper-outer quadrant of left breast in female, estrogen receptor positive ([place])  C50.412    Z17.0     SUMMARY OF ONCOLOGIC HISTORY: Oncology History  Malignant neoplasm of upper-outer quadrant of left breast in female, estrogen receptor positive ([place])  05/26/2017 Surgery   Left lumpectomy: DCIS high-grade with necrosis and calcifications spanning 3.3 cm, margins negative, 0/1 lymph node negative, ER 30%, PR 0% Tis N0 stage 0   07/18/2017 - 08/18/2017 Radiation Therapy   Adjuvant radiation therapy with Dr. Isidore Moos    Anti-estrogen oral therapy   Patient was offered tamoxifen but she did not want to take it     CHIEF COMPLIANT: Follow-up on tamoxifen therapy  INTERVAL HISTORY: [name] is a 53 y.o. with above-mentioned history of left breast cancer who underwent lumpectomy, radiation, and is currently on tamoxifen. I last saw her a year ago. Mammogram on 04/18/19 showed no evidence of malignancy bilaterally. She presents to the clinic today for follow-up.  She denies any lumps or nodules in the breast.  She has not been taking tamoxifen.  REVIEW OF SYSTEMS:   Constitutional: Denies fevers, chills or abnormal weight loss Eyes: Denies blurriness of vision Ears, nose, mouth, throat, and face: Denies mucositis or sore throat Respiratory: Denies cough, dyspnea or wheezes Cardiovascular: Denies palpitation, chest discomfort Gastrointestinal: Denies nausea, heartburn or change in bowel habits Skin: Denies abnormal skin rashes  Lymphatics: Denies new lymphadenopathy or easy bruising Neurological: Denies numbness, tingling or new weaknesses Behavioral/Psych: Mood is stable, no new changes  Extremities: No lower extremity edema Breast: denies any pain or lumps or nodules in either breasts All other systems were reviewed with the patient and are negative.  I have reviewed the past medical history, past surgical history, social history and family history with the patient and they are unchanged from previous note.  ALLERGIES:  has No Known Allergies.  MEDICATIONS:  Current Outpatient Medications  Medication Sig Dispense Refill  . nitrofurantoin (MACRODANTIN) 100 MG capsule Take 100 mg by mouth as needed.    . valACYclovir (VALTREX) 500 MG tablet Take 1 tablet (500 mg total) by mouth 2 (two) times daily. 10 tablet 3   No current facility-administered medications for this visit.     PHYSICAL EXAMINATION: ECOG PERFORMANCE STATUS: 1 - Symptomatic but completely ambulatory  Vitals:   06/28/19 0846  BP: 111/67  Pulse: 74  Resp: 18  Temp: 98.2 F (36.8 C)  SpO2: 99%   Filed Weights   06/28/19 0846  Weight: 146 lb (66.2 kg)    GENERAL: alert, no distress and comfortable SKIN: skin color, texture, turgor are normal, no rashes or significant lesions EYES: normal, Conjunctiva are pink and non-injected, sclera clear OROPHARYNX: no exudate, no erythema and lips, buccal mucosa, and tongue normal  NECK: supple, thyroid normal size, non-tender, without nodularity LYMPH: no palpable lymphadenopathy in the cervical, axillary or inguinal LUNGS: clear to auscultation and percussion with normal breathing effort HEART: regular rate & rhythm and no murmurs and no lower extremity edema ABDOMEN: abdomen soft, non-tender and normal bowel sounds MUSCULOSKELETAL: no cyanosis of digits and  no clubbing  NEURO: alert & oriented x 3 with fluent speech, no focal motor/sensory deficits EXTREMITIES: No lower extremity edema  BREAST: No palpable masses or nodules in either right or left breasts. No palpable axillary supraclavicular or infraclavicular adenopathy no breast tenderness or nipple discharge. (exam performed in the presence of a chaperone)  LABORATORY DATA:  I have reviewed the data as listed CMP Latest Ref Rng & Units 10/06/2018 12/08/2015  Glucose 65 - 99 mg/dL 77 83  BUN 7 - 25 mg/dL 17 10  Creatinine 0.50 - 1.05 mg/dL 0.79 0.76  Sodium 135 - 146 mmol/L 142 143  Potassium 3.5 - 5.3 mmol/L 4.4 4.3  Chloride 98 - 110 mmol/L 107 106  CO2 20 - 32 mmol/L 24 27  Calcium 8.6 - 10.4 mg/dL 9.7 9.4  Total Protein 6.1 - 8.1 g/dL 6.7 6.5  Total Bilirubin 0.2 - 1.2 mg/dL 0.8 0.8  Alkaline Phos 33 - 130 U/L - 75  AST 10 - 35 U/L 28 27  ALT 6 - 29 U/L 29 30(H)    Lab Results  Component Value Date   WBC 3.8 10/06/2018   HGB 14.5 10/06/2018   HCT 40.9 10/06/2018   MCV 90.5 10/06/2018   PLT 209 10/06/2018   NEUTROABS 1,961 10/06/2018    ASSESSMENT & PLAN:  Malignant neoplasm of upper-outer quadrant of left breast in female, estrogen receptor positive ([place]) 05/26/2017:Left lumpectomy: DCIS high-grade with necrosis and calcifications spanning 3.3 cm, margins negative, 0/1 lymph node negative, ER 30%, PR 0% Tis N0 stage 0 07/18/2017-adjuvant radiation therapy with Dr. Isidore Moos  Treatment plan: Patient was offered tamoxifen therapy but because of concern for hair loss she did not want to take it.  Breast cancer surveillance: 1.  Breast exam 06/28/2019: Benign 2. mammogram 04/18/2019: No evidence of malignancy, breast density category C  Return to clinic in 1 year for follow-up    No orders of the defined types were placed in this encounter.  The patient has a good understanding of the overall plan. she agrees with it. she will call with any problems that may develop before the next visit here.  [name], MD 06/28/2019  Julious Oka Dorshimer am acting as scribe for Dr. [name].  I have  reviewed the above documentation for accuracy and completeness, and I agree with the above.

## 2019-06-28 ENCOUNTER — Other Ambulatory Visit: Payer: Self-pay

## 2019-06-28 ENCOUNTER — Inpatient Hospital Stay: Payer: 59 | Attending: Hematology and Oncology | Admitting: Hematology and Oncology

## 2019-06-28 DIAGNOSIS — Z170 Estrogen receptor positive status [ER+]: Secondary | ICD-10-CM | POA: Insufficient documentation

## 2019-06-28 DIAGNOSIS — Z79810 Long term (current) use of selective estrogen receptor modulators (SERMs): Secondary | ICD-10-CM | POA: Diagnosis not present

## 2019-06-28 DIAGNOSIS — C50412 Malignant neoplasm of upper-outer quadrant of left female breast: Secondary | ICD-10-CM

## 2019-06-28 DIAGNOSIS — Z923 Personal history of irradiation: Secondary | ICD-10-CM | POA: Diagnosis not present

## 2019-06-28 NOTE — Assessment & Plan Note (Signed)
05/26/2017:Left lumpectomy: DCIS high-grade with necrosis and calcifications spanning 3.3 cm, margins negative, 0/1 lymph node negative, ER 30%, PR 0% Tis N0 stage 0 07/18/2017-adjuvant radiation therapy with Dr. Isidore Moos  Treatment plan: Adjuvant antiestrogen therapy with tamoxifen 20 mg daily times 5 yearsafter radiation is complete most likely to start in February 2019  Tamoxifen toxicities: Denies any side effects from tamoxifen.  Denies any hot flashes or arthralgias or myalgias  Breast cancer surveillance: 1.  Breast exam 06/28/2019: Benign 2. mammogram 04/18/2019: No evidence of malignancy, breast density category C  Return to clinic in 1 year for follow-up

## 2019-06-29 ENCOUNTER — Telehealth: Payer: Self-pay | Admitting: Hematology and Oncology

## 2019-06-29 NOTE — Telephone Encounter (Signed)
I talk with patient regarding schedule  

## 2019-09-21 DIAGNOSIS — Z01419 Encounter for gynecological examination (general) (routine) without abnormal findings: Secondary | ICD-10-CM | POA: Diagnosis not present

## 2020-01-29 ENCOUNTER — Other Ambulatory Visit: Payer: Self-pay

## 2020-01-29 ENCOUNTER — Other Ambulatory Visit: Payer: BC Managed Care – PPO | Admitting: Internal Medicine

## 2020-01-29 DIAGNOSIS — Z86000 Personal history of in-situ neoplasm of breast: Secondary | ICD-10-CM

## 2020-01-29 DIAGNOSIS — Z0000 Encounter for general adult medical examination without abnormal findings: Secondary | ICD-10-CM

## 2020-01-29 DIAGNOSIS — C50412 Malignant neoplasm of upper-outer quadrant of left female breast: Secondary | ICD-10-CM | POA: Diagnosis not present

## 2020-01-29 DIAGNOSIS — Z8639 Personal history of other endocrine, nutritional and metabolic disease: Secondary | ICD-10-CM

## 2020-01-29 DIAGNOSIS — Z170 Estrogen receptor positive status [ER+]: Secondary | ICD-10-CM

## 2020-01-29 DIAGNOSIS — Z13220 Encounter for screening for lipoid disorders: Secondary | ICD-10-CM

## 2020-01-29 LAB — COMPLETE METABOLIC PANEL WITH GFR
AG Ratio: 2 (calc) (ref 1.0–2.5)
ALT: 13 U/L (ref 6–29)
AST: 14 U/L (ref 10–35)
Albumin: 4.2 g/dL (ref 3.6–5.1)
Alkaline phosphatase (APISO): 90 U/L (ref 37–153)
BUN: 12 mg/dL (ref 7–25)
CO2: 25 mmol/L (ref 20–32)
Calcium: 9.3 mg/dL (ref 8.6–10.4)
Chloride: 108 mmol/L (ref 98–110)
Creat: 0.74 mg/dL (ref 0.50–1.05)
GFR, Est African American: 106 mL/min/{1.73_m2} (ref >=60)
GFR, Est Non African American: 92 mL/min/{1.73_m2} (ref >=60)
Globulin: 2.1 g/dL (calc) (ref 1.9–3.7)
Glucose, Bld: 81 mg/dL (ref 65–99)
Potassium: 4.1 mmol/L (ref 3.5–5.3)
Sodium: 140 mmol/L (ref 135–146)
Total Bilirubin: 0.8 mg/dL (ref 0.2–1.2)
Total Protein: 6.3 g/dL (ref 6.1–8.1)

## 2020-01-29 LAB — CBC WITH DIFFERENTIAL/PLATELET
Absolute Monocytes: 300 cells/uL (ref 200–950)
Basophils Absolute: 30 cells/uL (ref 0–200)
Basophils Relative: 0.8 %
Eosinophils Absolute: 81 cells/uL (ref 15–500)
Eosinophils Relative: 2.2 %
HCT: 40.6 % (ref 35.0–45.0)
Hemoglobin: 13.8 g/dL (ref 11.7–15.5)
Lymphs Abs: 1391 cells/uL (ref 850–3900)
MCH: 31.2 pg (ref 27.0–33.0)
MCHC: 34 g/dL (ref 32.0–36.0)
MCV: 91.6 fL (ref 80.0–100.0)
MPV: 10.6 fL (ref 7.5–12.5)
Monocytes Relative: 8.1 %
Neutro Abs: 1898 cells/uL (ref 1500–7800)
Neutrophils Relative %: 51.3 %
Platelets: 204 10*3/uL (ref 140–400)
RBC: 4.43 10*6/uL (ref 3.80–5.10)
RDW: 12.2 % (ref 11.0–15.0)
Total Lymphocyte: 37.6 %
WBC: 3.7 10*3/uL — ABNORMAL LOW (ref 3.8–10.8)

## 2020-01-29 LAB — LIPID PANEL
Cholesterol: 199 mg/dL (ref <200)
HDL: 80 mg/dL (ref >=50)
LDL Cholesterol (Calc): 105 mg/dL (calc) — ABNORMAL HIGH
Non-HDL Cholesterol (Calc): 119 mg/dL (calc) (ref <130)
Total CHOL/HDL Ratio: 2.5 (calc) (ref <5.0)
Triglycerides: 62 mg/dL (ref <150)

## 2020-01-29 LAB — TSH: TSH: 1.32 mIU/L

## 2020-01-29 LAB — VITAMIN D 25 HYDROXY (VIT D DEFICIENCY, FRACTURES): Vit D, 25-Hydroxy: 46 ng/mL (ref 30–100)

## 2020-02-07 ENCOUNTER — Encounter: Payer: Self-pay | Admitting: Internal Medicine

## 2020-02-07 ENCOUNTER — Ambulatory Visit (INDEPENDENT_AMBULATORY_CARE_PROVIDER_SITE_OTHER): Payer: BC Managed Care – PPO | Admitting: Internal Medicine

## 2020-02-07 ENCOUNTER — Other Ambulatory Visit: Payer: Self-pay

## 2020-02-07 VITALS — BP 90/60 | HR 82 | Ht 66.0 in | Wt 142.0 lb

## 2020-02-07 DIAGNOSIS — Z0000 Encounter for general adult medical examination without abnormal findings: Secondary | ICD-10-CM

## 2020-02-07 DIAGNOSIS — Z86000 Personal history of in-situ neoplasm of breast: Secondary | ICD-10-CM

## 2020-02-07 LAB — POCT URINALYSIS DIPSTICK
Appearance: NEGATIVE
Bilirubin, UA: NEGATIVE
Blood, UA: NEGATIVE
Glucose, UA: NEGATIVE
Ketones, UA: NEGATIVE
Leukocytes, UA: NEGATIVE
Nitrite, UA: NEGATIVE
Odor: NEGATIVE
Protein, UA: NEGATIVE
Spec Grav, UA: 1.01
Urobilinogen, UA: 0.2 U/dL
pH, UA: 6.5

## 2020-02-07 MED ORDER — VALACYCLOVIR HCL 500 MG PO TABS: 500.0000 mg | ORAL_TABLET | Freq: Two times a day (BID) | ORAL | 3 refills | Status: DC

## 2020-02-07 NOTE — Progress Notes (Signed)
   Subjective:    Patient ID: [name], female    DOB: 1965-09-28, 54 y.o.   MRN: [id number]  HPI 54 year old Female for health maintenance exam and evaluation of medical issues.  Recently became engaged.  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  Has been traveling some.  Has had 2 COVID-19 immunizations without issue.  Tetanus immunization is up-to-date.  She underwent left breast lumpectomy for high-grade DCIS in 2018.  She received radiation therapy.  0 out of 1 lymph node negative.  ER 30% PR 0.  Followed by Dr. Lindi Wolf.  Breast implants were removed with a lumpectomy and subsequently had replacement right and left breast implants by Dr. Kathyrn Wolf June 03, 2017.  Has had sclerotherapy at local dermatology office from time to time.  GYN is Sandra Louis, MD.  She initially had bilateral breast implants in 2012.  She took oral contraceptives until 2017.  History of right ovarian cyst 2002.  Also had multiple clear follicular cyst in both ovaries at that time.  In 2003 transvaginal ultrasound showed resolution of right ovarian cyst.  Pap was positive for HPV 16 in 2019 but HPV was not detected in 2020 Pap.  Social history: Social alcohol consumption.  2 sons, young adults in good health.  Non-smoker.  Teaching laboratory technician.  Husband died of sudden cardiac arrest in 2016.  Family history: Father died at age 65 due to myeloproliferative disorder.  Mother healthy.  2 sisters in excellent health.      Review of Systems  Constitutional: Negative.   Respiratory: Negative.   Cardiovascular: Negative.   Gastrointestinal: Negative.   Genitourinary: Negative.   Neurological: Negative.   Psychiatric/Behavioral: Negative.        Objective:   Physical Exam Blood pressure 90/60, pulse 82, weight 142 pounds height 5 feet 6 inches BMI 22.92  Skin warm and dry.  Nodes none.  TMs are clear.  Neck is supple without thyromegaly or adenopathy.  Chest clear.   Bilateral breast implants.  Cardiac exam regular rate and rhythm normal S1 and S2 without murmurs.  Abdomen soft nondistended without hepatosplenomegaly masses or tenderness.  Extremities without deformity.  Neuro intact without focal deficits.  Affect thought and judgment normal.  Labs reviewed and has very mild elevation of LDL at 105 likely related to the pandemic and will not treat at this point time.  Reassess in 1 year.  Remainder of labs are within normal limits including vitamin D TSH, c-Met and CBC.     Assessment & Plan:  Routine health maintenance exam  History of DCIS left breast 2018  History of HSV-refill Valtrex for 1 year  Plan: Return in 1 year or as needed.

## 2020-02-09 NOTE — Patient Instructions (Signed)
It was a pleasure to see you today.  Valtrex refilled for 1 year.  Follow-up here in 1 year or as needed.  Labs are within normal limits just slight elevation of LDL which I think can be managed with diet and exercise.

## 2020-03-24 DIAGNOSIS — Z923 Personal history of irradiation: Secondary | ICD-10-CM | POA: Diagnosis not present

## 2020-03-24 DIAGNOSIS — Z86000 Personal history of in-situ neoplasm of breast: Secondary | ICD-10-CM | POA: Diagnosis not present

## 2020-03-24 DIAGNOSIS — Z9882 Breast implant status: Secondary | ICD-10-CM | POA: Diagnosis not present

## 2020-04-17 DIAGNOSIS — D2371 Other benign neoplasm of skin of right lower limb, including hip: Secondary | ICD-10-CM | POA: Diagnosis not present

## 2020-04-17 DIAGNOSIS — C4441 Basal cell carcinoma of skin of scalp and neck: Secondary | ICD-10-CM | POA: Diagnosis not present

## 2020-06-02 ENCOUNTER — Other Ambulatory Visit: Payer: Self-pay | Admitting: Adult Health

## 2020-06-02 DIAGNOSIS — Z853 Personal history of malignant neoplasm of breast: Secondary | ICD-10-CM

## 2020-06-19 ENCOUNTER — Other Ambulatory Visit: Payer: Self-pay

## 2020-06-19 ENCOUNTER — Ambulatory Visit
Admission: RE | Admit: 2020-06-19 | Discharge: 2020-06-19 | Disposition: A | Discharge status: Home / Self Care | Payer: BC Managed Care – PPO | Source: Ambulatory Visit | Attending: Adult Health | Admitting: Adult Health

## 2020-06-19 DIAGNOSIS — Z853 Personal history of malignant neoplasm of breast: Secondary | ICD-10-CM

## 2020-06-19 DIAGNOSIS — R922 Inconclusive mammogram: Secondary | ICD-10-CM | POA: Diagnosis not present

## 2020-06-26 NOTE — Progress Notes (Signed)
Patient Care Team: Baxley, Cresenciano Lick, MD as PCP - General (Internal Medicine) [name], MD as Consulting Physician (Hematology and Oncology) Rolm Bookbinder, MD as Consulting Physician (General Surgery) Eppie Gibson, MD as Attending Physician (Radiation Oncology) Gardenia Phlegm, NP as Nurse Practitioner (Hematology and Oncology) Irene Limbo, MD as Consulting Physician (Plastic Surgery)  DIAGNOSIS:    ICD-10-CM   1. Malignant neoplasm of upper-outer quadrant of left breast in female, estrogen receptor positive ([place])  C50.412    Z17.0     SUMMARY OF ONCOLOGIC HISTORY: Oncology History  Malignant neoplasm of upper-outer quadrant of left breast in female, estrogen receptor positive ([place])  05/26/2017 Surgery   Left lumpectomy: DCIS high-grade with necrosis and calcifications spanning 3.3 cm, margins negative, 0/1 lymph node negative, ER 30%, PR 0% Tis N0 stage 0   07/18/2017 - 08/18/2017 Radiation Therapy   Adjuvant radiation therapy with Dr. Isidore Moos    Anti-estrogen oral therapy   Patient was offered tamoxifen but she did not want to take it     CHIEF COMPLIANT: Follow-up of left breast cancer on tamoxifen therapy  INTERVAL HISTORY: [name] is a 54 y.o. with above-mentioned history of left breast cancer who underwent lumpectomy, radiation, and is currently on tamoxifen. Mammogram on 06/19/20 showed no evidence of malignancy bilaterally. She presents to the clinic today for follow-up.  ALLERGIES:  has No Known Allergies.  MEDICATIONS:  Current Outpatient Medications  Medication Sig Dispense Refill  . valACYclovir (VALTREX) 500 MG tablet Take 1 tablet (500 mg total) by mouth 2 (two) times daily. 10 tablet 3   No current facility-administered medications for this visit.    PHYSICAL EXAMINATION: ECOG PERFORMANCE STATUS: 1 - Symptomatic but completely ambulatory  Vitals:   06/27/20 0837  BP: 117/63  Pulse: 70  Resp: 18  Temp: 97.7 F (36.5 C)    SpO2: 99%   Filed Weights   06/27/20 0837  Weight: 142 lb 11.2 oz (64.7 kg)    BREAST: No palpable masses or nodules in either right or left breasts. No palpable axillary supraclavicular or infraclavicular adenopathy no breast tenderness or nipple discharge. (exam performed in the presence of a chaperone)  LABORATORY DATA:  I have reviewed the data as listed CMP Latest Ref Rng & Units 01/29/2020 10/06/2018 12/08/2015  Glucose 65 - 99 mg/dL 81 77 83  BUN 7 - 25 mg/dL 12 17 10   Creatinine 0.50 - 1.05 mg/dL 0.74 0.79 0.76  Sodium 135 - 146 mmol/L 140 142 143  Potassium 3.5 - 5.3 mmol/L 4.1 4.4 4.3  Chloride 98 - 110 mmol/L 108 107 106  CO2 20 - 32 mmol/L 25 24 27   Calcium 8.6 - 10.4 mg/dL 9.3 9.7 9.4  Total Protein 6.1 - 8.1 g/dL 6.3 6.7 6.5  Total Bilirubin 0.2 - 1.2 mg/dL 0.8 0.8 0.8  Alkaline Phos 33 - 130 U/L - - 75  AST 10 - 35 U/L 14 28 27   ALT 6 - 29 U/L 13 29 30(H)    Lab Results  Component Value Date   WBC 3.7 (L) 01/29/2020   HGB 13.8 01/29/2020   HCT 40.6 01/29/2020   MCV 91.6 01/29/2020   PLT 204 01/29/2020   NEUTROABS 1,898 01/29/2020    ASSESSMENT & PLAN:  Malignant neoplasm of upper-outer quadrant of left breast in female, estrogen receptor positive ([place]) 05/26/2017:Left lumpectomy: DCIS high-grade with necrosis and calcifications spanning 3.3 cm, margins negative, 0/1 lymph node negative, ER 30%, PR 0% Tis N0 stage 0  07/18/2017-adjuvant radiation therapy with Dr. Isidore Moos  Treatment plan: Patient was offered tamoxifen therapy but because of concern for hair loss she did not want to take it.  Breast cancer surveillance: 1.Breast exam 06/27/2020: Benign 2.mammogram 06/19/2020 : No evidence of malignancy, breast density category C Because of her breast density and the fact that originally her breast cancer could not be diagnosed with a mammogram, we will obtain a breast MRI every 2 to 3 years. Next breast MRI will be done in June 2022.  I will call her  with the results of the MRI.  She was engaged earlier in the year and is hoping to get married during the holidays when her sons are here. Return to clinic in 1 year for follow-up    No orders of the defined types were placed in this encounter.  The patient has a good understanding of the overall plan. she agrees with it. she will call with any problems that may develop before the next visit here.  Total time spent: 20 mins including face to face time and time spent for planning, charting and coordination of care  [name], MD 06/27/2020  I, Cloyde Reams Dorshimer, am acting as scribe for Dr. [name].  I have reviewed the above documentation for accuracy and completeness, and I agree with the above.

## 2020-06-27 ENCOUNTER — Other Ambulatory Visit: Payer: Self-pay

## 2020-06-27 ENCOUNTER — Telehealth: Payer: Self-pay | Admitting: Hematology and Oncology

## 2020-06-27 ENCOUNTER — Inpatient Hospital Stay: Payer: BC Managed Care – PPO | Attending: Hematology and Oncology | Admitting: Hematology and Oncology

## 2020-06-27 DIAGNOSIS — Z79810 Long term (current) use of selective estrogen receptor modulators (SERMs): Secondary | ICD-10-CM | POA: Insufficient documentation

## 2020-06-27 DIAGNOSIS — C50412 Malignant neoplasm of upper-outer quadrant of left female breast: Secondary | ICD-10-CM | POA: Insufficient documentation

## 2020-06-27 DIAGNOSIS — Z923 Personal history of irradiation: Secondary | ICD-10-CM | POA: Insufficient documentation

## 2020-06-27 DIAGNOSIS — Z170 Estrogen receptor positive status [ER+]: Secondary | ICD-10-CM | POA: Diagnosis not present

## 2020-06-27 NOTE — Assessment & Plan Note (Signed)
05/26/2017:Left lumpectomy: DCIS high-grade with necrosis and calcifications spanning 3.3 cm, margins negative, 0/1 lymph node negative, ER 30%, PR 0% Tis N0 stage 0 07/18/2017-adjuvant radiation therapy with Dr. Isidore Moos  Treatment plan: Patient was offered tamoxifen therapy but because of concern for hair loss she did not want to take it.  Breast cancer surveillance: 1.Breast exam 06/27/2020: Benign 2.mammogram 06/19/2020 : No evidence of malignancy, breast density category C  Return to clinic in 1 year for follow-up

## 2020-06-27 NOTE — Telephone Encounter (Signed)
Scheduled appointment per 11/12 los. Spoke to patient who is aware of appointment date and time.

## 2020-07-24 DIAGNOSIS — C4441 Basal cell carcinoma of skin of scalp and neck: Secondary | ICD-10-CM | POA: Diagnosis not present

## 2020-07-28 NOTE — Telephone Encounter (Signed)
This encounter was created in error - please disregard.

## 2020-09-26 DIAGNOSIS — Z01419 Encounter for gynecological examination (general) (routine) without abnormal findings: Secondary | ICD-10-CM | POA: Diagnosis not present

## 2020-12-19 ENCOUNTER — Other Ambulatory Visit: Payer: BC Managed Care – PPO

## 2021-02-17 ENCOUNTER — Other Ambulatory Visit: Payer: Self-pay | Admitting: Adult Health

## 2021-02-17 DIAGNOSIS — Z1231 Encounter for screening mammogram for malignant neoplasm of breast: Secondary | ICD-10-CM

## 2021-04-06 DIAGNOSIS — Z9882 Breast implant status: Secondary | ICD-10-CM | POA: Diagnosis not present

## 2021-04-06 DIAGNOSIS — Z923 Personal history of irradiation: Secondary | ICD-10-CM | POA: Diagnosis not present

## 2021-04-06 DIAGNOSIS — Z86000 Personal history of in-situ neoplasm of breast: Secondary | ICD-10-CM | POA: Diagnosis not present

## 2021-05-06 ENCOUNTER — Other Ambulatory Visit: Payer: Self-pay | Admitting: Adult Health

## 2021-05-06 DIAGNOSIS — Z9889 Other specified postprocedural states: Secondary | ICD-10-CM

## 2021-06-19 ENCOUNTER — Ambulatory Visit: Payer: BC Managed Care – PPO

## 2021-06-22 ENCOUNTER — Other Ambulatory Visit: Payer: Self-pay

## 2021-06-22 ENCOUNTER — Ambulatory Visit
Admission: RE | Admit: 2021-06-22 | Discharge: 2021-06-22 | Disposition: A | Discharge status: Home / Self Care | Payer: BC Managed Care – PPO | Source: Ambulatory Visit | Attending: Adult Health | Admitting: Adult Health

## 2021-06-22 DIAGNOSIS — Z853 Personal history of malignant neoplasm of breast: Secondary | ICD-10-CM | POA: Diagnosis not present

## 2021-06-22 DIAGNOSIS — Z9889 Other specified postprocedural states: Secondary | ICD-10-CM

## 2021-06-22 DIAGNOSIS — R922 Inconclusive mammogram: Secondary | ICD-10-CM | POA: Diagnosis not present

## 2021-06-24 NOTE — Progress Notes (Incomplete)
Patient Care Team: Elby Showers, MD as PCP - General (Internal Medicine) [name], MD as Consulting Physician (Hematology and Oncology) Rolm Bookbinder, MD as Consulting Physician (General Surgery) Eppie Gibson, MD as Attending Physician (Radiation Oncology) Gardenia Phlegm, NP as Nurse Practitioner (Hematology and Oncology) Irene Limbo, MD as Consulting Physician (Plastic Surgery)  DIAGNOSIS: No diagnosis found.  SUMMARY OF ONCOLOGIC HISTORY: Oncology History  Malignant neoplasm of upper-outer quadrant of left breast in female, estrogen receptor positive ([place])  05/26/2017 Surgery   Left lumpectomy: DCIS high-grade with necrosis and calcifications spanning 3.3 cm, margins negative, 0/1 lymph node negative, ER 30%, PR 0% Tis N0 stage 0   07/18/2017 - 08/18/2017 Radiation Therapy   Adjuvant radiation therapy with Dr. Isidore Moos    Anti-estrogen oral therapy   Patient was offered tamoxifen but she did not want to take it     CHIEF COMPLIANT: Follow-up of left breast cancer on tamoxifen therapy  INTERVAL HISTORY: [name] is a 55 y.o. with above-mentioned history of left breast cancer who underwent lumpectomy, radiation, and is currently on tamoxifen. Mammogram on 06/22/2021 showed no evidence of malignancy bilaterally. She presents to the clinic today for follow-up.   ALLERGIES:  has No Known Allergies.  MEDICATIONS:  Current Outpatient Medications  Medication Sig Dispense Refill   valACYclovir (VALTREX) 500 MG tablet Take 1 tablet (500 mg total) by mouth 2 (two) times daily. 10 tablet 3   No current facility-administered medications for this visit.    PHYSICAL EXAMINATION: ECOG PERFORMANCE STATUS: {CHL ONC ECOG [id number]}  There were no vitals filed for this visit. There were no vitals filed for this visit.  BREAST:*** No palpable masses or nodules in either right or left breasts. No palpable axillary supraclavicular or infraclavicular  adenopathy no breast tenderness or nipple discharge. (exam performed in the presence of a chaperone)  LABORATORY DATA:  I have reviewed the data as listed CMP Latest Ref Rng & Units 01/29/2020 10/06/2018 12/08/2015  Glucose 65 - 99 mg/dL 81 77 83  BUN 7 - 25 mg/dL 12 17 10   Creatinine 0.50 - 1.05 mg/dL 0.74 0.79 0.76  Sodium 135 - 146 mmol/L 140 142 143  Potassium 3.5 - 5.3 mmol/L 4.1 4.4 4.3  Chloride 98 - 110 mmol/L 108 107 106  CO2 20 - 32 mmol/L 25 24 27   Calcium 8.6 - 10.4 mg/dL 9.3 9.7 9.4  Total Protein 6.1 - 8.1 g/dL 6.3 6.7 6.5  Total Bilirubin 0.2 - 1.2 mg/dL 0.8 0.8 0.8  Alkaline Phos 33 - 130 U/L - - 75  AST 10 - 35 U/L 14 28 27   ALT 6 - 29 U/L 13 29 30(H)    Lab Results  Component Value Date   WBC 3.7 (L) 01/29/2020   HGB 13.8 01/29/2020   HCT 40.6 01/29/2020   MCV 91.6 01/29/2020   PLT 204 01/29/2020   NEUTROABS 1,898 01/29/2020    ASSESSMENT & PLAN:  No problem-specific Assessment & Plan notes found for this encounter.    No orders of the defined types were placed in this encounter.  The patient has a good understanding of the overall plan. she agrees with it. she will call with any problems that may develop before the next visit here.  Total time spent: *** mins including face to face time and time spent for planning, charting and coordination of care  Rulon Eisenmenger, MD, MPH 06/24/2021  I, Thana Ates, am acting as scribe for Dr. [name].  {  insert scribe attestation}

## 2021-06-26 ENCOUNTER — Inpatient Hospital Stay: Payer: BC Managed Care – PPO | Attending: Hematology and Oncology | Admitting: Hematology and Oncology

## 2021-06-26 NOTE — Assessment & Plan Note (Deleted)
05/26/2017:Left lumpectomy: DCIS high-grade with necrosis and calcifications spanning 3.3 cm, margins negative, 0/1 lymph node negative, ER 30%, PR 0% Tis N0 stage 0 07/18/2017-adjuvant radiation therapy with Dr. Isidore Moos  Treatment plan:Patient was offered tamoxifen therapy but because of concern for hair loss she did not want to take it.  Breast cancer surveillance: 1.Breast exam11/06/2021: Benign 2.mammogram  06/22/2021 No evidence of malignancy, breast density category C Because of her breast density and the fact that originally her breast cancer could not be diagnosed with a mammogram, we will obtain a breast MRI every 2 to 3 years.  She has not had a breast MRI yet.  She was engaged earlier in the year and is hoping to get married during the holidays when her sons are here. Return to clinic in 1 year for follow-up

## 2021-07-02 ENCOUNTER — Telehealth: Payer: Self-pay | Admitting: Hematology and Oncology

## 2021-07-02 NOTE — Telephone Encounter (Signed)
Sch per 11/17 inbakset, left msg

## 2021-07-20 DIAGNOSIS — M25511 Pain in right shoulder: Secondary | ICD-10-CM | POA: Diagnosis not present

## 2021-07-28 DIAGNOSIS — M25512 Pain in left shoulder: Secondary | ICD-10-CM | POA: Diagnosis not present

## 2021-07-28 DIAGNOSIS — M25511 Pain in right shoulder: Secondary | ICD-10-CM | POA: Diagnosis not present

## 2021-07-30 NOTE — Assessment & Plan Note (Signed)
05/26/2017:Left lumpectomy: DCIS high-grade with necrosis and calcifications spanning 3.3 cm, margins negative, 0/1 lymph node negative, ER 30%, PR 0% Tis N0 stage 0 07/18/2017-adjuvant radiation therapy with Dr. Isidore Moos  Treatment plan:Patient was offered tamoxifen therapy but because of concern for hair loss she did not want to take it.  Breast cancer surveillance: 1.Breast exam12/16/2022: Benign 2.mammogram  06/22/2021 No evidence of malignancy, breast density category C  Because of her breast density and the fact that originally her breast cancer could not be diagnosed with a mammogram, we will obtain a breast MRI every 2 to 3 years. Breast MRI has not been done  She was engaged earlier in the year and is hoping to get married during the holidays when her sons are here. Return to clinic in 1 year for follow-up

## 2021-07-30 NOTE — Progress Notes (Signed)
Patient Care Team: Baxley, Cresenciano Lick, MD as PCP - General (Internal Medicine) [name], MD as Consulting Physician (Hematology and Oncology) Rolm Bookbinder, MD as Consulting Physician (General Surgery) Eppie Gibson, MD as Attending Physician (Radiation Oncology) Gardenia Phlegm, NP as Nurse Practitioner (Hematology and Oncology) Irene Limbo, MD as Consulting Physician (Plastic Surgery)  DIAGNOSIS:    ICD-10-CM   1. Malignant neoplasm of upper-outer quadrant of left breast in female, estrogen receptor positive ([place])  C50.412    Z17.0       SUMMARY OF ONCOLOGIC HISTORY: Oncology History  Malignant neoplasm of upper-outer quadrant of left breast in female, estrogen receptor positive ([place])  05/26/2017 Surgery   Left lumpectomy: DCIS high-grade with necrosis and calcifications spanning 3.3 cm, margins negative, 0/1 lymph node negative, ER 30%, PR 0% Tis N0 stage 0   07/18/2017 - 08/18/2017 Radiation Therapy   Adjuvant radiation therapy with Dr. Isidore Moos    Anti-estrogen oral therapy   Patient was offered tamoxifen but she did not want to take it     CHIEF COMPLIANT: Follow-up of left breast cancer    INTERVAL HISTORY: [name] is a 56 y.o. with above-mentioned history of  left breast cancer who underwent lumpectomy, radiation, and is currently on surveillance mammogram on 06/22/2021 showed no evidence of malignancy bilaterally. She presents to the clinic today for follow-up.  She denies any lumps or nodules in the breast.  She decided not to do breast MRIs because of risk of false positivity and requirement for biopsies if abnormalities are found.  She understood that majority of these findings could be benign and did not want to go that route.  ALLERGIES:  has No Known Allergies.  MEDICATIONS:  Current Outpatient Medications  Medication Sig Dispense Refill   valACYclovir (VALTREX) 500 MG tablet Take 1 tablet (500 mg total) by mouth 2 (two) times daily.  10 tablet 3   No current facility-administered medications for this visit.    PHYSICAL EXAMINATION: ECOG PERFORMANCE STATUS: 1 - Symptomatic but completely ambulatory  Vitals:   07/31/21 1100  BP: 126/72  Pulse: 60  Resp: 16  Temp: 97.7 F (36.5 C)  SpO2: 100%   Filed Weights   07/31/21 1100  Weight: 142 lb 4.8 oz (64.5 kg)    BREAST: No palpable masses or nodules in either right or left breasts. No palpable axillary supraclavicular or infraclavicular adenopathy no breast tenderness or nipple discharge. (exam performed in the presence of a chaperone)  LABORATORY DATA:  I have reviewed the data as listed CMP Latest Ref Rng & Units 01/29/2020 10/06/2018 12/08/2015  Glucose 65 - 99 mg/dL 81 77 83  BUN 7 - 25 mg/dL 12 17 10   Creatinine 0.50 - 1.05 mg/dL 0.74 0.79 0.76  Sodium 135 - 146 mmol/L 140 142 143  Potassium 3.5 - 5.3 mmol/L 4.1 4.4 4.3  Chloride 98 - 110 mmol/L 108 107 106  CO2 20 - 32 mmol/L 25 24 27   Calcium 8.6 - 10.4 mg/dL 9.3 9.7 9.4  Total Protein 6.1 - 8.1 g/dL 6.3 6.7 6.5  Total Bilirubin 0.2 - 1.2 mg/dL 0.8 0.8 0.8  Alkaline Phos 33 - 130 U/L - - 75  AST 10 - 35 U/L 14 28 27   ALT 6 - 29 U/L 13 29 30(H)    Lab Results  Component Value Date   WBC 3.7 (L) 01/29/2020   HGB 13.8 01/29/2020   HCT 40.6 01/29/2020   MCV 91.6 01/29/2020   PLT 204  01/29/2020   NEUTROABS 1,898 01/29/2020    ASSESSMENT & PLAN:  Malignant neoplasm of upper-outer quadrant of left breast in female, estrogen receptor positive ([place]) 05/26/2017: Left lumpectomy: DCIS high-grade with necrosis and calcifications spanning 3.3 cm, margins negative, 0/1 lymph node negative, ER 30%, PR 0% Tis N0 stage 0 07/18/2017 - adjuvant radiation therapy with Dr. Isidore Moos   Treatment plan: Patient was offered tamoxifen therapy but because of concern for hair loss she did not want to take it.   Breast cancer surveillance: 1.  Breast exam 07/31/2021: Benign 2. mammogram  06/22/2021 No evidence of  malignancy, breast density category C  After thinking about pros and cons of breast MRI patient decided that she does not want to do the MRIs.  She got married in December 2021.  Her son is engaged and is going to be married soon.   Return to clinic in 1 year for follow-up    No orders of the defined types were placed in this encounter.  The patient has a good understanding of the overall plan. she agrees with it. she will call with any problems that may develop before the next visit here.  Total time spent: 20 mins including face to face time and time spent for planning, charting and coordination of care  Rulon Eisenmenger, MD, MPH 07/31/2021  I, Thana Ates, am acting as scribe for Dr. [name].  I have reviewed the above documentation for accuracy and completeness, and I agree with the above.

## 2021-07-31 ENCOUNTER — Inpatient Hospital Stay: Payer: BC Managed Care – PPO | Attending: Hematology and Oncology | Admitting: Hematology and Oncology

## 2021-07-31 ENCOUNTER — Other Ambulatory Visit: Payer: Self-pay

## 2021-07-31 DIAGNOSIS — Z853 Personal history of malignant neoplasm of breast: Secondary | ICD-10-CM | POA: Diagnosis not present

## 2021-07-31 DIAGNOSIS — Z923 Personal history of irradiation: Secondary | ICD-10-CM | POA: Diagnosis not present

## 2021-07-31 DIAGNOSIS — Z170 Estrogen receptor positive status [ER+]: Secondary | ICD-10-CM | POA: Diagnosis not present

## 2021-07-31 DIAGNOSIS — C50412 Malignant neoplasm of upper-outer quadrant of left female breast: Secondary | ICD-10-CM | POA: Diagnosis not present

## 2021-07-31 MED ORDER — MELOXICAM 5 MG PO CAPS: 5.0000 mg | ORAL_CAPSULE | Freq: Every day | ORAL | Status: AC | PRN

## 2021-08-04 DIAGNOSIS — L738 Other specified follicular disorders: Secondary | ICD-10-CM | POA: Diagnosis not present

## 2021-08-04 DIAGNOSIS — Z85828 Personal history of other malignant neoplasm of skin: Secondary | ICD-10-CM | POA: Diagnosis not present

## 2021-08-04 DIAGNOSIS — D2371 Other benign neoplasm of skin of right lower limb, including hip: Secondary | ICD-10-CM | POA: Diagnosis not present

## 2021-08-04 DIAGNOSIS — I788 Other diseases of capillaries: Secondary | ICD-10-CM | POA: Diagnosis not present

## 2021-08-14 ENCOUNTER — Telehealth: Payer: BC Managed Care – PPO | Admitting: Physician Assistant

## 2021-08-14 DIAGNOSIS — U071 COVID-19: Secondary | ICD-10-CM

## 2021-08-14 NOTE — Progress Notes (Signed)
Virtual Visit Consent   [name], you are scheduled for a virtual visit with a [facility] provider today.     Just as with appointments in the office, your consent must be obtained to participate.  Your consent will be active for this visit and any virtual visit you may have with one of our providers in the next 365 days.     If you have a MyChart account, a copy of this consent can be sent to you electronically.  All virtual visits are billed to your insurance company just like a traditional visit in the office.    As this is a virtual visit, video technology does not allow for your provider to perform a traditional examination.  This may limit your provider's ability to fully assess your condition.  If your provider identifies any concerns that need to be evaluated in person or the need to arrange testing (such as labs, EKG, etc.), we will make arrangements to do so.     Although advances in technology are sophisticated, we cannot ensure that it will always work on either your end or our end.  If the connection with a video visit is poor, the visit may have to be switched to a telephone visit.  With either a video or telephone visit, we are not always able to ensure that we have a secure connection.     I need to obtain your verbal consent now.   Are you willing to proceed with your visit today?    [name] has provided verbal consent on 08/14/2021 for a virtual visit (video or telephone).   [name], [place]   Date: 08/14/2021 10:05 AM   Virtual Visit via Video Note   I, [name], connected with  [name]  ([id number], 04/13/1966) on 08/14/21 at 10:00 AM EST by a video-enabled telemedicine application and verified that I am speaking with the correct person using two identifiers.  Location: Patient: Virtual Visit Location Patient: Home Provider: Virtual Visit Location Provider: Home Office   I discussed the limitations of evaluation and  management by telemedicine and the availability of in person appointments. The patient expressed understanding and agreed to proceed.    History of Present Illness: [name] is a 55 y.o. who identifies as a female who was assigned female at birth, and is being seen today for COVID-19. Patient endorses symptoms starting Tuesday afternoon/evening with fatigue, head/chest congestion, malaise. Thinks initially had fever but seems to have resolved. Also noted chills. Her sister is COVID positive and they were around each other at [facility] As such she tested at home yesterday and was positive. Denies chest pain or SOB. Denies nausea or vomiting but notes some loose stools. Is staying well-hydrated. Has taken OTC Dayquil/Nyquil for symptoms. Is interested in antivirals.    HPI: HPI  Problems:  Patient Active Problem List   Diagnosis Date Noted   History of ductal carcinoma in situ (DCIS) of breast 11/10/2017   History of therapeutic radiation 11/10/2017   Malignant neoplasm of upper-outer quadrant of left breast in female, estrogen receptor positive ([place]) 06/08/2017    Allergies: No Known Allergies Medications:  Current Outpatient Medications:    Meloxicam 5 MG CAPS, Take 5 mg by mouth daily as needed., Disp: 30 capsule, Rfl:    valACYclovir (VALTREX) 500 MG tablet, Take 1 tablet (500 mg total) by mouth 2 (two) times daily., Disp: 10 tablet, Rfl: 3  Observations/Objective: Patient is well-developed, well-nourished  in no acute distress.  Resting comfortably at home.  Head is normocephalic, atraumatic.  No labored breathing. Speech is clear and coherent with logical content.  Patient is alert and oriented at baseline.   Assessment and Plan: 1. COVID-19 - MyChart COVID-19 home monitoring program; Future  Discussed risks/benefits of antiviral medications including most common potential ADRs. Patient voiced understanding and would like to proceed with antiviral medication. They are  candidate for molnupiravir. Rx sent to pharmacy. Supportive measures, OTC medications and vitamin regimen reviewed. Patient has been enrolled in a MyChart COVID symptom monitoring program. Samule Dry reviewed in detail. Strict ER precautions discussed with patient.    Follow Up Instructions: I discussed the assessment and treatment plan with the patient. The patient was provided an opportunity to ask questions and all were answered. The patient agreed with the plan and demonstrated an understanding of the instructions.  A copy of instructions were sent to the patient via MyChart unless otherwise noted below.   The patient was advised to call back or seek an in-person evaluation if the symptoms worsen or if the condition fails to improve as anticipated.  Time:  I spent 12 minutes with the patient via telehealth technology discussing the above problems/concerns.    Sandra Rio, PA-C

## 2021-08-14 NOTE — Patient Instructions (Signed)
[name], thank you for joining Leeanne Rio, PA-C for today's virtual visit.  While this provider is not your primary care provider (PCP), if your PCP is located in our provider database this encounter information will be shared with them immediately following your visit.  Consent: (Patient) [name] provided verbal consent for this virtual visit at the beginning of the encounter.  Current Medications:  Current Outpatient Medications:    Meloxicam 5 MG CAPS, Take 5 mg by mouth daily as needed., Disp: 30 capsule, Rfl:    valACYclovir (VALTREX) 500 MG tablet, Take 1 tablet (500 mg total) by mouth 2 (two) times daily., Disp: 10 tablet, Rfl: 3   Medications ordered in this encounter:  No orders of the defined types were placed in this encounter.    *If you need refills on other medications prior to your next appointment, please contact your pharmacy*  Follow-Up: Call back or seek an in-person evaluation if the symptoms worsen or if the condition fails to improve as anticipated.  Other Instructions Please keep well-hydrated and get plenty of rest. Start a saline nasal rinse to flush out your nasal passages. You can use plain Mucinex to help thin congestion. If you have a humidifier, running in the bedroom at night. I want you to start OTC vitamin D3 1000 units daily, vitamin C 1000 mg daily, and a zinc supplement. Please take prescribed medications as directed.  You have been enrolled in a MyChart symptom monitoring program. Please answer these questions daily so we can keep track of how you are doing.  You were to quarantine for 5 days from onset of your symptoms.  After day 5, if you have had no fever and you are feeling better, you can end quarantine but need to mask for an additional 5 days. After day 5 if you have a fever or are having significant symptoms, please quarantine for full 10 days.  If you note any worsening of symptoms, any significant shortness  of breath or any chest pain, please seek ER evaluation ASAP.  Please do not delay care!  COVID-19: What to Do if You Are Sick If you test positive and are an older adult or someone who is at high risk of getting very sick from COVID-19, treatment may be available. Contact a healthcare provider right away after a positive test to determine if you are eligible, even if your symptoms are mild right now. You can also visit a Test to Treat location and, if eligible, receive a prescription from a provider. Don't delay: Treatment must be started within the first few days to be effective. If you have a fever, cough, or other symptoms, you might have COVID-19. Most people have mild illness and are able to recover at home. If you are sick: Keep track of your symptoms. If you have an emergency warning sign (including trouble breathing), call 911. Steps to help prevent the spread of COVID-19 if you are sick If you are sick with COVID-19 or think you might have COVID-19, follow the steps below to care for yourself and to help protect other people in your home and community. Stay home except to get medical care Stay home. Most people with COVID-19 have mild illness and can recover at home without medical care. Do not leave your home, except to get medical care. Do not visit public areas and do not go to places where you are unable to wear a mask. Take care of yourself. Get rest and  stay hydrated. Take over-the-counter medicines, such as acetaminophen, to help you feel better. Stay in touch with your doctor. Call before you get medical care. Be sure to get care if you have trouble breathing, or have any other emergency warning signs, or if you think it is an emergency. Avoid public transportation, ride-sharing, or taxis if possible. Get tested If you have symptoms of COVID-19, get tested. While waiting for test results, stay away from others, including staying apart from those living in your household. Get tested  as soon as possible after your symptoms start. Treatments may be available for people with COVID-19 who are at risk for becoming very sick. Don't delay: Treatment must be started early to be effective--some treatments must begin within 5 days of your first symptoms. Contact your healthcare provider right away if your test result is positive to determine if you are eligible. Self-tests are one of several options for testing for the virus that causes COVID-19 and may be more convenient than laboratory-based tests and point-of-care tests. Ask your healthcare provider or your local health department if you need help interpreting your test results. You can visit your state, tribal, local, and territorial health department's website to look for the latest local information on testing sites. Separate yourself from other people As much as possible, stay in a specific room and away from other people and pets in your home. If possible, you should use a separate bathroom. If you need to be around other people or animals in or outside of the home, wear a well-fitting mask. Tell your close contacts that they may have been exposed to COVID-19. An infected person can spread COVID-19 starting 48 hours (or 2 days) before the person has any symptoms or tests positive. By letting your close contacts know they may have been exposed to COVID-19, you are helping to protect everyone. See COVID-19 and Animals if you have questions about pets. If you are diagnosed with COVID-19, someone from the health department may call you. Answer the call to slow the spread. Monitor your symptoms Symptoms of COVID-19 include fever, cough, or other symptoms. Follow care instructions from your healthcare provider and local health department. Your local health authorities may give instructions on checking your symptoms and reporting information. When to seek emergency medical attention Look for emergency warning signs* for COVID-19. If someone  is showing any of these signs, seek emergency medical care immediately: Trouble breathing Persistent pain or pressure in the chest New confusion Inability to wake or stay awake Pale, gray, or blue-colored skin, lips, or nail beds, depending on skin tone *This list is not all possible symptoms. Please call your medical provider for any other symptoms that are severe or concerning to you. Call 911 or call ahead to your local emergency facility: Notify the operator that you are seeking care for someone who has or may have COVID-19. Call ahead before visiting your doctor Call ahead. Many medical visits for routine care are being postponed or done by phone or telemedicine. If you have a medical appointment that cannot be postponed, call your doctor's office, and tell them you have or may have COVID-19. This will help the office protect themselves and other patients. If you are sick, wear a well-fitting mask You should wear a mask if you must be around other people or animals, including pets (even at home). Wear a mask with the best fit, protection, and comfort for you. You don't need to wear the mask if you are alone. If  you can't put on a mask (because of trouble breathing, for example), cover your coughs and sneezes in some other way. Try to stay at least 6 feet away from other people. This will help protect the people around you. Masks should not be placed on young children under age 29 years, anyone who has trouble breathing, or anyone who is not able to remove the mask without help. Cover your coughs and sneezes Cover your mouth and nose with a tissue when you cough or sneeze. Throw away used tissues in a lined trash can. Immediately wash your hands with soap and water for at least 20 seconds. If soap and water are not available, clean your hands with an alcohol-based hand sanitizer that contains at least 60% alcohol. Clean your hands often Wash your hands often with soap and water for at least  20 seconds. This is especially important after blowing your nose, coughing, or sneezing; going to the bathroom; and before eating or preparing food. Use hand sanitizer if soap and water are not available. Use an alcohol-based hand sanitizer with at least 60% alcohol, covering all surfaces of your hands and rubbing them together until they feel dry. Soap and water are the best option, especially if hands are visibly dirty. Avoid touching your eyes, nose, and mouth with unwashed hands. Handwashing Tips Avoid sharing personal household items Do not share dishes, drinking glasses, cups, eating utensils, towels, or bedding with other people in your home. Wash these items thoroughly after using them with soap and water or put in the dishwasher. Clean surfaces in your home regularly Clean and disinfect high-touch surfaces (for example, doorknobs, tables, handles, light switches, and countertops) in your "sick room" and bathroom. In shared spaces, you should clean and disinfect surfaces and items after each use by the person who is ill. If you are sick and cannot clean, a caregiver or other person should only clean and disinfect the area around you (such as your bedroom and bathroom) on an as needed basis. Your caregiver/other person should wait as long as possible (at least several hours) and wear a mask before entering, cleaning, and disinfecting shared spaces that you use. Clean and disinfect areas that may have blood, stool, or body fluids on them. Use household cleaners and disinfectants. Clean visible dirty surfaces with household cleaners containing soap or detergent. Then, use a household disinfectant. Use a product from [employer] List N: Disinfectants for Coronavirus (TOIZT-24). Be sure to follow the instructions on the label to ensure safe and effective use of the product. Many products recommend keeping the surface wet with a disinfectant for a certain period of time (look at "contact time" on the  product label). You may also need to wear personal protective equipment, such as gloves, depending on the directions on the product label. Immediately after disinfecting, wash your hands with soap and water for 20 seconds. For completed guidance on cleaning and disinfecting your home, visit Complete Disinfection Guidance. Take steps to improve ventilation at home Improve ventilation (air flow) at home to help prevent from spreading COVID-19 to other people in your household. Clear out COVID-19 virus particles in the air by opening windows, using air filters, and turning on fans in your home. Use this interactive tool to learn how to improve air flow in your home. When you can be around others after being sick with COVID-19 Deciding when you can be around others is different for different situations. Find out when you can safely end home isolation. For  any additional questions about your care, contact your healthcare provider or state or local health department. 11/04/2020 Content source: [facility] for [facility]ization and Respiratory Diseases ([facility]), Division of Viral Diseases This information is not intended to replace advice given to you by your health care provider. Make sure you discuss any questions you have with your health care provider. Document Revised: 12/18/2020 Document Reviewed: 12/18/2020 Elsevier Patient Education  2022 [employer].     If you have been instructed to have an in-person evaluation today at a local Urgent Care facility, please use the link below. It will take you to a list of all of our available [facility], including address, phone number and hours of operation. Please do not delay care.  [facility]  If you or a family member do not have a primary care provider, use the link below to schedule a visit and establish care. When you choose a [facility] primary care physician or advanced practice provider, you gain a long-term partner  in health. Find a Primary Care Provider  Learn more about [facility] in-office and virtual care options: [facility]

## 2021-08-15 ENCOUNTER — Telehealth: Payer: BC Managed Care – PPO | Admitting: Physician Assistant

## 2021-08-15 MED ORDER — MOLNUPIRAVIR EUA 200MG CAPSULE: 4.0000 | ORAL_CAPSULE | Freq: Two times a day (BID) | ORAL | 0 refills | Status: AC

## 2021-08-15 NOTE — Progress Notes (Signed)
No charge. Patient was seen yesterday and antiviral was to be sent in. Pharmacy did not receive so Rx resent today.

## 2021-09-28 DIAGNOSIS — Z01419 Encounter for gynecological examination (general) (routine) without abnormal findings: Secondary | ICD-10-CM | POA: Diagnosis not present

## 2021-10-15 ENCOUNTER — Other Ambulatory Visit: Payer: BC Managed Care – PPO | Admitting: Internal Medicine

## 2021-10-15 ENCOUNTER — Other Ambulatory Visit: Payer: Self-pay

## 2021-10-15 DIAGNOSIS — R5383 Other fatigue: Secondary | ICD-10-CM

## 2021-10-15 DIAGNOSIS — Z136 Encounter for screening for cardiovascular disorders: Secondary | ICD-10-CM | POA: Diagnosis not present

## 2021-10-15 DIAGNOSIS — Z86000 Personal history of in-situ neoplasm of breast: Secondary | ICD-10-CM

## 2021-10-16 ENCOUNTER — Ambulatory Visit (INDEPENDENT_AMBULATORY_CARE_PROVIDER_SITE_OTHER): Payer: BC Managed Care – PPO | Admitting: Internal Medicine

## 2021-10-16 ENCOUNTER — Encounter: Payer: Self-pay | Admitting: Internal Medicine

## 2021-10-16 VITALS — BP 108/82 | HR 77 | Temp 97.5°F | Ht 65.0 in | Wt 139.5 lb

## 2021-10-16 DIAGNOSIS — Z86000 Personal history of in-situ neoplasm of breast: Secondary | ICD-10-CM | POA: Diagnosis not present

## 2021-10-16 DIAGNOSIS — Z23 Encounter for immunization: Secondary | ICD-10-CM

## 2021-10-16 DIAGNOSIS — Z0000 Encounter for general adult medical examination without abnormal findings: Secondary | ICD-10-CM

## 2021-10-16 LAB — CBC WITH DIFFERENTIAL/PLATELET
Absolute Monocytes: 476 cells/uL (ref 200–950)
Basophils Absolute: 29 cells/uL (ref 0–200)
Basophils Relative: 0.5 %
Eosinophils Absolute: 52 cells/uL (ref 15–500)
Eosinophils Relative: 0.9 %
HCT: 43.1 % (ref 35.0–45.0)
Hemoglobin: 14.2 g/dL (ref 11.7–15.5)
Lymphs Abs: 1392 cells/uL (ref 850–3900)
MCH: 31 pg (ref 27.0–33.0)
MCHC: 32.9 g/dL (ref 32.0–36.0)
MCV: 94.1 fL (ref 80.0–100.0)
MPV: 10.3 fL (ref 7.5–12.5)
Monocytes Relative: 8.2 %
Neutro Abs: 3851 cells/uL (ref 1500–7800)
Neutrophils Relative %: 66.4 %
Platelets: 209 10*3/uL (ref 140–400)
RBC: 4.58 10*6/uL (ref 3.80–5.10)
RDW: 12.3 % (ref 11.0–15.0)
Total Lymphocyte: 24 %
WBC: 5.8 10*3/uL (ref 3.8–10.8)

## 2021-10-16 LAB — POCT URINALYSIS DIPSTICK
Bilirubin, UA: NEGATIVE
Blood, UA: NEGATIVE
Glucose, UA: NEGATIVE
Ketones, UA: NEGATIVE
Leukocytes, UA: NEGATIVE
Nitrite, UA: NEGATIVE
Protein, UA: NEGATIVE
Spec Grav, UA: 1.015 (ref 1.010–1.025)
Urobilinogen, UA: 0.2 E.U./dL
pH, UA: 6 (ref 5.0–8.0)

## 2021-10-16 LAB — COMPLETE METABOLIC PANEL WITH GFR
AG Ratio: 2 (calc) (ref 1.0–2.5)
ALT: 27 U/L (ref 6–29)
AST: 28 U/L (ref 10–35)
Albumin: 4.4 g/dL (ref 3.6–5.1)
Alkaline phosphatase (APISO): 85 U/L (ref 37–153)
BUN: 10 mg/dL (ref 7–25)
CO2: 28 mmol/L (ref 20–32)
Calcium: 9.6 mg/dL (ref 8.6–10.4)
Chloride: 101 mmol/L (ref 98–110)
Creat: 0.78 mg/dL (ref 0.50–1.03)
Globulin: 2.2 g/dL (calc) (ref 1.9–3.7)
Glucose, Bld: 82 mg/dL (ref 65–99)
Potassium: 4.4 mmol/L (ref 3.5–5.3)
Sodium: 138 mmol/L (ref 135–146)
Total Bilirubin: 0.6 mg/dL (ref 0.2–1.2)
Total Protein: 6.6 g/dL (ref 6.1–8.1)
eGFR: 89 mL/min/{1.73_m2} (ref >=60)

## 2021-10-16 LAB — LIPID PANEL
Cholesterol: 211 mg/dL — ABNORMAL HIGH (ref <200)
HDL: 101 mg/dL (ref >=50)
LDL Cholesterol (Calc): 95 mg/dL (calc)
Non-HDL Cholesterol (Calc): 110 mg/dL (calc) (ref <130)
Total CHOL/HDL Ratio: 2.1 (calc) (ref <5.0)
Triglycerides: 63 mg/dL (ref <150)

## 2021-10-16 LAB — TSH: TSH: 1.21 mIU/L (ref 0.40–4.50)

## 2021-10-16 MED ORDER — NITROFURANTOIN MONOHYD MACRO 100 MG PO CAPS: 100.0000 mg | ORAL_CAPSULE | ORAL | 3 refills | Status: DC | PRN

## 2021-10-16 NOTE — Progress Notes (Unsigned)
° °  Subjective:    Patient ID: [name], female    DOB: [birth year], 56 y.o.   MRN: [id number]  HPI 56 year old Female seen for health    Review of Systems     Objective:   Physical Exam        Assessment & Plan:

## 2021-10-16 NOTE — Patient Instructions (Signed)
°

## 2021-11-30 DIAGNOSIS — Z923 Personal history of irradiation: Secondary | ICD-10-CM | POA: Diagnosis not present

## 2021-11-30 DIAGNOSIS — Z9882 Breast implant status: Secondary | ICD-10-CM | POA: Diagnosis not present

## 2021-11-30 DIAGNOSIS — Z86000 Personal history of in-situ neoplasm of breast: Secondary | ICD-10-CM | POA: Diagnosis not present

## 2021-11-30 DIAGNOSIS — T8549XA Other mechanical complication of breast prosthesis and implant, initial encounter: Secondary | ICD-10-CM | POA: Diagnosis not present

## 2022-01-06 DIAGNOSIS — Z9882 Breast implant status: Secondary | ICD-10-CM | POA: Diagnosis not present

## 2022-01-06 DIAGNOSIS — Z923 Personal history of irradiation: Secondary | ICD-10-CM | POA: Diagnosis not present

## 2022-01-06 DIAGNOSIS — Z86000 Personal history of in-situ neoplasm of breast: Secondary | ICD-10-CM | POA: Diagnosis not present

## 2022-01-06 DIAGNOSIS — T8549XD Other mechanical complication of breast prosthesis and implant, subsequent encounter: Secondary | ICD-10-CM | POA: Diagnosis not present

## 2022-02-04 DIAGNOSIS — C50912 Malignant neoplasm of unspecified site of left female breast: Secondary | ICD-10-CM | POA: Diagnosis not present

## 2022-05-10 ENCOUNTER — Other Ambulatory Visit: Payer: Self-pay | Admitting: Obstetrics and Gynecology

## 2022-05-10 DIAGNOSIS — Z1231 Encounter for screening mammogram for malignant neoplasm of breast: Secondary | ICD-10-CM

## 2022-05-24 DIAGNOSIS — D123 Benign neoplasm of transverse colon: Secondary | ICD-10-CM | POA: Diagnosis not present

## 2022-05-24 DIAGNOSIS — K648 Other hemorrhoids: Secondary | ICD-10-CM | POA: Diagnosis not present

## 2022-05-24 DIAGNOSIS — D122 Benign neoplasm of ascending colon: Secondary | ICD-10-CM | POA: Diagnosis not present

## 2022-05-24 DIAGNOSIS — Z86010 Personal history of colonic polyps: Secondary | ICD-10-CM | POA: Diagnosis not present

## 2022-05-24 DIAGNOSIS — D120 Benign neoplasm of cecum: Secondary | ICD-10-CM | POA: Diagnosis not present

## 2022-05-24 DIAGNOSIS — K5730 Diverticulosis of large intestine without perforation or abscess without bleeding: Secondary | ICD-10-CM | POA: Diagnosis not present

## 2022-06-16 DIAGNOSIS — Z0289 Encounter for other administrative examinations: Secondary | ICD-10-CM

## 2022-06-25 ENCOUNTER — Ambulatory Visit: Payer: BC Managed Care – PPO

## 2022-07-19 ENCOUNTER — Ambulatory Visit
Admission: RE | Admit: 2022-07-19 | Discharge: 2022-07-19 | Disposition: A | Discharge status: Home / Self Care | Payer: BC Managed Care – PPO | Source: Ambulatory Visit | Attending: Obstetrics and Gynecology | Admitting: Obstetrics and Gynecology

## 2022-07-19 DIAGNOSIS — Z1231 Encounter for screening mammogram for malignant neoplasm of breast: Secondary | ICD-10-CM

## 2022-07-30 ENCOUNTER — Inpatient Hospital Stay: Payer: BC Managed Care – PPO | Admitting: Hematology and Oncology

## 2022-07-30 NOTE — Progress Notes (Signed)
Patient Care Team: Elby Showers, MD as PCP - General (Internal Medicine) [name], MD as Consulting Physician (Hematology and Oncology) Rolm Bookbinder, MD as Consulting Physician (General Surgery) Eppie Gibson, MD as Attending Physician (Radiation Oncology) Gardenia Phlegm, NP as Nurse Practitioner (Hematology and Oncology) Irene Limbo, MD as Consulting Physician (Plastic Surgery)  DIAGNOSIS: No diagnosis found.  SUMMARY OF ONCOLOGIC HISTORY: Oncology History  Malignant neoplasm of upper-outer quadrant of left breast in female, estrogen receptor positive ([place])  05/26/2017 Surgery   Left lumpectomy: DCIS high-grade with necrosis and calcifications spanning 3.3 cm, margins negative, 0/1 lymph node negative, ER 30%, PR 0% Tis N0 stage 0   07/18/2017 - 08/18/2017 Radiation Therapy   Adjuvant radiation therapy with Dr. Isidore Moos    Anti-estrogen oral therapy   Patient was offered tamoxifen but she did not want to take it     CHIEF COMPLIANT:   INTERVAL HISTORY: AVANTI JETTER is a   ALLERGIES:  has No Known Allergies.  MEDICATIONS:  Current Outpatient Medications  Medication Sig Dispense Refill   Meloxicam 5 MG CAPS Take 5 mg by mouth daily as needed. (Patient not taking: Reported on 10/16/2021) 30 capsule    nitrofurantoin, macrocrystal-monohydrate, (MACROBID) 100 MG capsule Take 1 capsule (100 mg total) by mouth as needed (1 capsule after intercourse). After intercourse 30 capsule 3   valACYclovir (VALTREX) 500 MG tablet Take 1 tablet (500 mg total) by mouth 2 (two) times daily. 10 tablet 3   No current facility-administered medications for this visit.    PHYSICAL EXAMINATION: ECOG PERFORMANCE STATUS: {CHL ONC ECOG PS:[id number]}  There were no vitals filed for this visit. There were no vitals filed for this visit.  BREAST:*** No palpable masses or nodules in either right or left breasts. No palpable axillary supraclavicular or infraclavicular  adenopathy no breast tenderness or nipple discharge. (exam performed in the presence of a chaperone)  LABORATORY DATA:  I have reviewed the data as listed    Latest Ref Rng & Units 10/15/2021    9:46 AM 01/29/2020    9:50 AM 10/06/2018   10:14 AM  CMP  Glucose 65 - 99 mg/dL 82  81  77   BUN 7 - 25 mg/dL '10  12  17   '$ Creatinine 0.50 - 1.03 mg/dL 0.78  0.74  0.79   Sodium 135 - 146 mmol/L 138  140  142   Potassium 3.5 - 5.3 mmol/L 4.4  4.1  4.4   Chloride 98 - 110 mmol/L 101  108  107   CO2 20 - 32 mmol/L '28  25  24   '$ Calcium 8.6 - 10.4 mg/dL 9.6  9.3  9.7   Total Protein 6.1 - 8.1 g/dL 6.6  6.3  6.7   Total Bilirubin 0.2 - 1.2 mg/dL 0.6  0.8  0.8   AST 10 - 35 U/L '28  14  28   '$ ALT 6 - 29 U/L '27  13  29     '$ Lab Results  Component Value Date   WBC 5.8 10/15/2021   HGB 14.2 10/15/2021   HCT 43.1 10/15/2021   MCV 94.1 10/15/2021   PLT 209 10/15/2021   NEUTROABS 3,851 10/15/2021    ASSESSMENT & PLAN:  No problem-specific Assessment & Plan notes found for this encounter.    No orders of the defined types were placed in this encounter.  The patient has a good understanding of the overall plan. she agrees with it. she will call  with any problems that may develop before the next visit here. Total time spent: 30 mins including face to face time and time spent for planning, charting and co-ordination of care   Suzzette Righter, [place] 07/30/22    I Gardiner Coins am acting as a Education administrator for [employer]  ***

## 2022-08-02 ENCOUNTER — Inpatient Hospital Stay: Payer: BC Managed Care – PPO | Attending: Hematology and Oncology | Admitting: Hematology and Oncology

## 2022-08-02 VITALS — BP 117/79 | HR 79 | Temp 97.7°F | Resp 18 | Ht 65.0 in | Wt 141.8 lb

## 2022-08-02 DIAGNOSIS — Z923 Personal history of irradiation: Secondary | ICD-10-CM | POA: Diagnosis not present

## 2022-08-02 DIAGNOSIS — Z170 Estrogen receptor positive status [ER+]: Secondary | ICD-10-CM

## 2022-08-02 DIAGNOSIS — C50412 Malignant neoplasm of upper-outer quadrant of left female breast: Secondary | ICD-10-CM

## 2022-08-02 DIAGNOSIS — Z853 Personal history of malignant neoplasm of breast: Secondary | ICD-10-CM | POA: Insufficient documentation

## 2022-08-02 NOTE — Assessment & Plan Note (Addendum)
05/26/2017: Left lumpectomy: DCIS high-grade with necrosis and calcifications spanning 3.3 cm, margins negative, 0/1 lymph node negative, ER 30%, PR 0% Tis N0 stage 0 07/18/2017 - adjuvant radiation therapy with Dr. Isidore Moos   Treatment plan: Patient was offered tamoxifen therapy but because of concern for hair loss she did not want to take it.   Breast cancer surveillance: 1.  Breast exam 08/02/2022: Benign 2. mammogram 07/20/2022 no evidence of malignancy, breast density category C   After thinking about pros and cons of breast MRI patient decided that she does not want to do the MRIs.   She got married in December 2021.    Return to clinic on an as-needed basis.

## 2022-08-18 ENCOUNTER — Ambulatory Visit: Payer: BC Managed Care – PPO

## 2022-08-31 DIAGNOSIS — D2371 Other benign neoplasm of skin of right lower limb, including hip: Secondary | ICD-10-CM | POA: Diagnosis not present

## 2022-08-31 DIAGNOSIS — D2272 Melanocytic nevi of left lower limb, including hip: Secondary | ICD-10-CM | POA: Diagnosis not present

## 2022-08-31 DIAGNOSIS — D235 Other benign neoplasm of skin of trunk: Secondary | ICD-10-CM | POA: Diagnosis not present

## 2022-08-31 DIAGNOSIS — Z85828 Personal history of other malignant neoplasm of skin: Secondary | ICD-10-CM | POA: Diagnosis not present

## 2022-08-31 DIAGNOSIS — L814 Other melanin hyperpigmentation: Secondary | ICD-10-CM | POA: Diagnosis not present

## 2022-10-04 ENCOUNTER — Other Ambulatory Visit (HOSPITAL_COMMUNITY)
Admission: RE | Admit: 2022-10-04 | Discharge: 2022-10-04 | Disposition: A | Discharge status: Home / Self Care | Payer: BC Managed Care – PPO | Source: Ambulatory Visit | Attending: Obstetrics and Gynecology | Admitting: Obstetrics and Gynecology

## 2022-10-04 ENCOUNTER — Other Ambulatory Visit: Payer: Self-pay | Admitting: Obstetrics and Gynecology

## 2022-10-04 DIAGNOSIS — Z01419 Encounter for gynecological examination (general) (routine) without abnormal findings: Secondary | ICD-10-CM | POA: Diagnosis not present

## 2022-10-06 LAB — CYTOLOGY - PAP
Comment: NEGATIVE
Diagnosis: NEGATIVE
Diagnosis: REACTIVE
High risk HPV: NEGATIVE

## 2022-11-04 ENCOUNTER — Telehealth: Payer: Self-pay | Admitting: Internal Medicine

## 2022-11-04 MED ORDER — NITROFURANTOIN MONOHYD MACRO 100 MG PO CAPS: 100.0000 mg | ORAL_CAPSULE | ORAL | 3 refills | Status: DC | PRN

## 2022-11-04 NOTE — Telephone Encounter (Signed)
Rx sent to pharmacy   

## 2022-11-04 NOTE — Telephone Encounter (Signed)
Noellie Handrahan [phone]  Larene Beach called to say the pharmacy said she was out of refills on below medication.  nitrofurantoin, macrocrystal-monohydrate, (MACROBID) 100 MG capsule   [id number] - [place], [place] - [address] AT [address] Phone: [phone]  Fax: [phone]

## 2023-01-13 NOTE — Progress Notes (Signed)
Patient Care Team: Margaree Mackintosh, MD as PCP - General (Internal Medicine) Serena Croissant, MD as Consulting Physician (Hematology and Oncology) Emelia Loron, MD as Consulting Physician (General Surgery) Lonie Peak, MD as Attending Physician (Radiation Oncology) Loa Socks, NP as Nurse Practitioner (Hematology and Oncology) Glenna Fellows, MD as Consulting Physician (Plastic Surgery)  Visit Date: 01/21/23  Subjective:    Patient ID: [name] , Female   DOB: 13-Jun-1966, 57 y.o.    MRN: [id number]   57 y.o. Female presents today for annual comprehensive physical exam and evaluation of medical issues.  Has been having some lower back pain and urinary frequency.  She underwent left breast lumpectomy for high-grade DCIS in 2018.  She received radiation therapy.  0 out of 1 lymph node negative.  ER 30% PR 0.  Seen by Dr. Pamelia Hoit.  Breast implants were removed with a lumpectomy and subsequently had replacement of right and left breast implants October 2018.  Glucose normal. Kidney functions normal. Potassium elevated at 5.4. ALT elevated at 59. CHOL slightly elevated at 207, LDL slightly elevated at 108. CBC normal. TSH normal at 1.01. She was drinking wine the night before her blood draw.  Pap smear last completed 10/04/22. Negative for intraepithelial lesions or malignancy. Recommended repeat in 2027. GYN is Dr. Gerald Leitz with [facility].    Mammogram last completed 07/20/22. No mammographic evidence of malignancy. Recommended repeat in 2024.  Colonoscopy is done through [facility].  Repeat study due in November 2024 according to computer records.  Social history: Non-smoker.  Social alcohol consumption.  2 adult sons.  Environmental consultant.  Married.   Family history: Father died at age 50 due to myeloproliferative disorder.  2 sisters in excellent health.  Past Medical History:  Diagnosis Date   Anxiety    Breast cancer (HCC)    Breast  mass 03/26/2017   3 oclock x 1 week    Cancer (HCC) 04/2017   left breast cancer   History of radiation therapy 07/18/17- 09/02/17   Left Breast 50.4 Gy in 28 fractions, Left Breast Boost 10 Gy in 5 fractions.    Personal history of radiation therapy      Family History  Problem Relation Age of Onset   Myelodysplastic syndrome Father    Heart disease Maternal Grandmother    Healthy Mother    Breast cancer Neg Hx     Social history: Married.  2 adult sons in good health.  Non-smoker.  Social alcohol consumption.  Environmental consultant.  Family history: Father died at age 63 due to myeloproliferative disorder.  2 sisters in excellent health.  Kristen Cardinal is her GYN physician.  Patient had bilateral breast implants initially in 2012.  She took oral contraceptives until 2017.  Had right ovarian cyst in 2002.  Also had multiple clear follicular cyst in both ovaries at that time.  In 2003 transvaginal ultrasound showed resolution of right ovarian cyst.  Pap was positive for HPV 16 in 2019 but was not detected in 2020 Pap.  Diagnosed with DCIS in 2018.  Received radiation therapy.  0 out of 1 lymph node negative.  ER 30% PR 0.  Breast implants were removed with lumpectomy and subsequently had replacement of right and left breast implants by Dr. Elberta Fortis June 03, 2017.   Review of Systems  Constitutional:  Negative for chills, fever, malaise/fatigue and weight loss.  HENT:  Negative for hearing loss, sinus pain and sore throat.   Respiratory:  Negative for cough, hemoptysis and shortness of breath.   Cardiovascular:  Negative for chest pain, palpitations, leg swelling and PND.  Gastrointestinal:  Negative for abdominal pain, constipation, diarrhea, heartburn, nausea and vomiting.  Genitourinary:  Negative for dysuria, frequency and urgency.  Musculoskeletal:  Negative for back pain, myalgias and neck pain.  Skin:  Negative for itching and rash.  Neurological:  Negative for  dizziness, tingling, seizures and headaches.  Endo/Heme/Allergies:  Negative for polydipsia.  Psychiatric/Behavioral:  Negative for depression. The patient is not nervous/anxious.         Objective:   Vitals: BP 100/78   Pulse 74   Resp 16   Ht 5\' 5"  (1.651 m)   Wt 142 lb 12 oz (64.8 kg)   LMP 03/04/2018   SpO2 98%   BMI 23.75 kg/m    Physical Exam Vitals and nursing note reviewed.  Constitutional:      General: She is not in acute distress.    Appearance: Normal appearance. She is not ill-appearing or toxic-appearing.  HENT:     Head: Normocephalic and atraumatic.     Right Ear: Hearing, tympanic membrane, ear canal and external ear normal.     Left Ear: Hearing, tympanic membrane, ear canal and external ear normal.     Mouth/Throat:     Pharynx: Oropharynx is clear.  Eyes:     Extraocular Movements: Extraocular movements intact.     Pupils: Pupils are equal, round, and reactive to light.  Neck:     Thyroid: No thyroid mass, thyromegaly or thyroid tenderness.     Vascular: No carotid bruit.  Cardiovascular:     Rate and Rhythm: Normal rate and regular rhythm. No extrasystoles are present.    Pulses:          Dorsalis pedis pulses are 1+ on the right side and 1+ on the left side.     Heart sounds: Normal heart sounds. No murmur heard.    No friction rub. No gallop.  Pulmonary:     Effort: Pulmonary effort is normal.     Breath sounds: Normal breath sounds. No decreased breath sounds, wheezing, rhonchi or rales.  Chest:     Chest wall: No mass.  Abdominal:     Palpations: Abdomen is soft. There is no hepatomegaly, splenomegaly or mass.     Tenderness: There is no abdominal tenderness.     Hernia: No hernia is present.  Musculoskeletal:     Cervical back: Normal range of motion.     Right lower leg: No edema.     Left lower leg: No edema.  Lymphadenopathy:     Cervical: No cervical adenopathy.     Upper Body:     Right upper body: No supraclavicular  adenopathy.     Left upper body: No supraclavicular adenopathy.  Skin:    General: Skin is warm and dry.  Neurological:     General: No focal deficit present.     Mental Status: She is alert and oriented to person, place, and time. Mental status is at baseline.     Sensory: Sensation is intact.     Motor: Motor function is intact. No weakness.     Deep Tendon Reflexes: Reflexes are normal and symmetric.  Psychiatric:        Attention and Perception: Attention normal.        Mood and Affect: Mood normal.        Speech: Speech normal.  Behavior: Behavior normal.        Thought Content: Thought content normal.        Cognition and Memory: Cognition normal.        Judgment: Judgment normal.       Results:   Studies obtained and personally reviewed by me:  Pap smear last completed 10/04/22. Negative for intraepithelial lesions or malignancy. Recommended repeat in 2027. GYN is Dr. Gerald Leitz with [facility].    Mammogram last completed 07/20/22. No mammographic evidence of malignancy. Recommended repeat in 2024.  Labs:       Component Value Date/Time   NA 139 01/20/2023 0926   K 5.4 (H) 01/20/2023 0926   CL 104 01/20/2023 0926   CO2 25 01/20/2023 0926   GLUCOSE 80 01/20/2023 0926   BUN 19 01/20/2023 0926   CREATININE 0.86 01/20/2023 0926   CALCIUM 9.7 01/20/2023 0926   PROT 6.9 01/20/2023 0926   ALBUMIN 4.3 12/08/2015 1122   AST 30 01/20/2023 0926   ALT 59 (H) 01/20/2023 0926   ALKPHOS 75 12/08/2015 1122   BILITOT 0.9 01/20/2023 0926   GFRNONAA 92 01/29/2020 0950   GFRAA 106 01/29/2020 0950     Lab Results  Component Value Date   WBC 4.3 01/20/2023   HGB 14.8 01/20/2023   HCT 44.5 01/20/2023   MCV 91.6 01/20/2023   PLT 217 01/20/2023    Lab Results  Component Value Date   CHOL 207 (H) 01/20/2023   HDL 83 01/20/2023   LDLCALC 108 (H) 01/20/2023   TRIG 70 01/20/2023   CHOLHDL 2.5 01/20/2023    No results found for: "HGBA1C"   Lab Results   Component Value Date   TSH 1.01 01/20/2023      Assessment & Plan:   Mild elevation of ALT: ALT elevated at 59. Will recheck this.  Pap smear last completed 10/04/22. Negative for intraepithelial lesions or malignancy. Recommended repeat in 2027. GYN is Dr. Gerald Leitz with [facility]. Urinalysis normal.  Mammogram: last completed 07/20/22. No mammographic evidence of malignancy. Recommended repeat in 2024.  Vaccine counseling: UTD on flu, tetanus, shingles vaccines.  Return in 3-4 weeks for repeat liver functions.  Remote history of breast cancer (DCIS) in 2018.  Received radiation therapy.  0 out of 1 lymph node negative.  Followed by Dr. Pamelia Hoit.  Has bilateral breast implants.  I,Alexander Ruley,acting as a Neurosurgeon for Margaree Mackintosh, MD.,have documented all relevant documentation on the behalf of Margaree Mackintosh, MD,as directed by  Margaree Mackintosh, MD while in the presence of Margaree Mackintosh, MD.   I, Margaree Mackintosh, MD, have reviewed all documentation for this visit. The documentation on 02/06/23 for the exam, diagnosis, procedures, and orders are all accurate and complete.

## 2023-01-20 ENCOUNTER — Other Ambulatory Visit: Payer: BC Managed Care – PPO

## 2023-01-20 DIAGNOSIS — Z0000 Encounter for general adult medical examination without abnormal findings: Secondary | ICD-10-CM | POA: Diagnosis not present

## 2023-01-20 DIAGNOSIS — Z13220 Encounter for screening for lipoid disorders: Secondary | ICD-10-CM | POA: Diagnosis not present

## 2023-01-20 DIAGNOSIS — Z1329 Encounter for screening for other suspected endocrine disorder: Secondary | ICD-10-CM

## 2023-01-21 ENCOUNTER — Ambulatory Visit (INDEPENDENT_AMBULATORY_CARE_PROVIDER_SITE_OTHER): Payer: BC Managed Care – PPO | Admitting: Internal Medicine

## 2023-01-21 ENCOUNTER — Encounter: Payer: Self-pay | Admitting: Internal Medicine

## 2023-01-21 VITALS — BP 100/78 | HR 74 | Resp 16 | Ht 65.0 in | Wt 142.8 lb

## 2023-01-21 DIAGNOSIS — Z136 Encounter for screening for cardiovascular disorders: Secondary | ICD-10-CM

## 2023-01-21 DIAGNOSIS — Z86000 Personal history of in-situ neoplasm of breast: Secondary | ICD-10-CM | POA: Diagnosis not present

## 2023-01-21 DIAGNOSIS — Z0000 Encounter for general adult medical examination without abnormal findings: Secondary | ICD-10-CM | POA: Diagnosis not present

## 2023-01-21 LAB — LIPID PANEL
Cholesterol: 207 mg/dL — ABNORMAL HIGH (ref <200)
HDL: 83 mg/dL (ref >=50)
LDL Cholesterol (Calc): 108 mg/dL (calc) — ABNORMAL HIGH
Non-HDL Cholesterol (Calc): 124 mg/dL (calc) (ref <130)
Total CHOL/HDL Ratio: 2.5 (calc) (ref <5.0)
Triglycerides: 70 mg/dL (ref <150)

## 2023-01-21 LAB — CBC WITH DIFFERENTIAL/PLATELET
Absolute Monocytes: 292 cells/uL (ref 200–950)
Basophils Absolute: 39 cells/uL (ref 0–200)
Basophils Relative: 0.9 %
Eosinophils Absolute: 69 cells/uL (ref 15–500)
Eosinophils Relative: 1.6 %
HCT: 44.5 % (ref 35.0–45.0)
Hemoglobin: 14.8 g/dL (ref 11.7–15.5)
Lymphs Abs: 1428 cells/uL (ref 850–3900)
MCH: 30.5 pg (ref 27.0–33.0)
MCHC: 33.3 g/dL (ref 32.0–36.0)
MCV: 91.6 fL (ref 80.0–100.0)
MPV: 10.1 fL (ref 7.5–12.5)
Monocytes Relative: 6.8 %
Neutro Abs: 2473 cells/uL (ref 1500–7800)
Neutrophils Relative %: 57.5 %
Platelets: 217 10*3/uL (ref 140–400)
RBC: 4.86 10*6/uL (ref 3.80–5.10)
RDW: 11.8 % (ref 11.0–15.0)
Total Lymphocyte: 33.2 %
WBC: 4.3 10*3/uL (ref 3.8–10.8)

## 2023-01-21 LAB — COMPLETE METABOLIC PANEL WITH GFR
AG Ratio: 1.9 (calc) (ref 1.0–2.5)
ALT: 59 U/L — ABNORMAL HIGH (ref 6–29)
AST: 30 U/L (ref 10–35)
Albumin: 4.5 g/dL (ref 3.6–5.1)
Alkaline phosphatase (APISO): 111 U/L (ref 37–153)
BUN: 19 mg/dL (ref 7–25)
CO2: 25 mmol/L (ref 20–32)
Calcium: 9.7 mg/dL (ref 8.6–10.4)
Chloride: 104 mmol/L (ref 98–110)
Creat: 0.86 mg/dL (ref 0.50–1.03)
Globulin: 2.4 g/dL (calc) (ref 1.9–3.7)
Glucose, Bld: 80 mg/dL (ref 65–99)
Potassium: 5.4 mmol/L — ABNORMAL HIGH (ref 3.5–5.3)
Sodium: 139 mmol/L (ref 135–146)
Total Bilirubin: 0.9 mg/dL (ref 0.2–1.2)
Total Protein: 6.9 g/dL (ref 6.1–8.1)
eGFR: 79 mL/min/{1.73_m2} (ref >=60)

## 2023-01-21 LAB — POCT URINALYSIS DIPSTICK
Bilirubin, UA: NEGATIVE
Blood, UA: NEGATIVE
Glucose, UA: NEGATIVE
Ketones, UA: NEGATIVE
Leukocytes, UA: NEGATIVE
Nitrite, UA: NEGATIVE
Protein, UA: NEGATIVE
Spec Grav, UA: 1.01 (ref 1.010–1.025)
Urobilinogen, UA: 0.2 E.U./dL
pH, UA: 6 (ref 5.0–8.0)

## 2023-01-21 LAB — TSH: TSH: 1.01 mIU/L (ref 0.40–4.50)

## 2023-01-25 ENCOUNTER — Encounter: Payer: Self-pay | Admitting: Internal Medicine

## 2023-02-06 NOTE — Patient Instructions (Addendum)
It was a pleasure to see you today.  There is slight elevation of 1 liver function which may be due to consuming a glass of wine or taking anti-inflammatory medications.  Return in early July to have this repeated.  Otherwise health maintenance exam and labs are entirely within normal limits.  Colonoscopy due November 2024 at [facility].  Please contact them for an appointment.

## 2023-02-10 ENCOUNTER — Other Ambulatory Visit: Payer: BC Managed Care – PPO

## 2023-02-10 DIAGNOSIS — E875 Hyperkalemia: Secondary | ICD-10-CM

## 2023-02-10 NOTE — Addendum Note (Signed)
Addended byConrad Grayson D on: 02/10/2023 11:21 AM   Modules accepted: Orders

## 2023-02-11 LAB — COMPREHENSIVE METABOLIC PANEL
AG Ratio: 2.1 (calc) (ref 1.0–2.5)
ALT: 18 U/L (ref 6–29)
AST: 22 U/L (ref 10–35)
Albumin: 4.4 g/dL (ref 3.6–5.1)
Alkaline phosphatase (APISO): 87 U/L (ref 37–153)
BUN: 11 mg/dL (ref 7–25)
CO2: 24 mmol/L (ref 20–32)
Calcium: 9.4 mg/dL (ref 8.6–10.4)
Chloride: 107 mmol/L (ref 98–110)
Creat: 0.75 mg/dL (ref 0.50–1.03)
Globulin: 2.1 g/dL (calc) (ref 1.9–3.7)
Glucose, Bld: 86 mg/dL (ref 65–99)
Potassium: 5.3 mmol/L (ref 3.5–5.3)
Sodium: 141 mmol/L (ref 135–146)
Total Bilirubin: 0.7 mg/dL (ref 0.2–1.2)
Total Protein: 6.5 g/dL (ref 6.1–8.1)

## 2023-02-11 NOTE — Progress Notes (Signed)
Patient has viewed results and provider notes on mychart.

## 2023-02-15 ENCOUNTER — Other Ambulatory Visit: Payer: BC Managed Care – PPO

## 2023-03-24 ENCOUNTER — Telehealth: Payer: BC Managed Care – PPO | Admitting: Internal Medicine

## 2023-03-24 ENCOUNTER — Telehealth: Payer: Self-pay | Admitting: Internal Medicine

## 2023-03-24 ENCOUNTER — Encounter: Payer: Self-pay | Admitting: Internal Medicine

## 2023-03-24 VITALS — Temp 100.5°F

## 2023-03-24 DIAGNOSIS — U071 COVID-19: Secondary | ICD-10-CM | POA: Diagnosis not present

## 2023-03-24 MED ORDER — HYDROCODONE BIT-HOMATROP MBR 5-1.5 MG/5ML PO SOLN: 5.0000 mL | Freq: Three times a day (TID) | ORAL | 0 refills | Status: DC | PRN

## 2023-03-24 MED ORDER — NIRMATRELVIR/RITONAVIR (PAXLOVID)TABLET: 3.0000 | ORAL_TABLET | Freq: Two times a day (BID) | ORAL | 0 refills | Status: AC

## 2023-03-24 NOTE — Progress Notes (Signed)
Patient Care Team: Margaree Mackintosh, MD as PCP - General (Internal Medicine) Serena Croissant, MD as Consulting Physician (Hematology and Oncology) Emelia Loron, MD as Consulting Physician (General Surgery) Lonie Peak, MD as Attending Physician (Radiation Oncology) Loa Socks, NP as Nurse Practitioner (Hematology and Oncology) Glenna Fellows, MD as Consulting Physician (Plastic Surgery)  I connected with Sim Boast on 03/24/23 at 1:38 PM by video enabled telemedicine visit and verified that I am speaking with the correct person using two identifiers.   I discussed the limitations, risks, security and privacy concerns of performing an evaluation and management service by telemedicine and the availability of in-person appointments. I also discussed with the patient that there may be a patient responsible charge related to this service. The patient expressed understanding and agreed to proceed.   Other persons participating in the visit and their role in the encounter: Medical scribe, Doylene Bode  Patient's location: Home  Provider's location: Clinic   I provided 10 minutes of face-to-face video visit time during this encounter, and > 50% was spent counseling as documented under my assessment & plan. She is identified using two identifiers, [name], a patient of this practice. She is in her home and I am in my office. She is agreeable to using this format today.  Chief Complaint: fatigue, cough   Subjective:    Patient ID: [name] , Female    DOB: 1966/01/27, 57 y.o.    MRN: [id number]   57 y.o. Female presents today for fatigue, cough, sore throat since 03/23/23. Reports positive at-home Covid-19 test today. She has company coming on Saturday. This is her fourth Covid-19 infection.  Past Medical History:  Diagnosis Date   Anxiety    Breast cancer (HCC)    Breast mass 03/26/2017   3 oclock x 1 week    Cancer (HCC) 04/2017   left breast  cancer   History of radiation therapy 07/18/17- 09/02/17   Left Breast 50.4 Gy in 28 fractions, Left Breast Boost 10 Gy in 5 fractions.    Personal history of radiation therapy      Family History  Problem Relation Age of Onset   Myelodysplastic syndrome Father    Heart disease Maternal Grandmother    Healthy Mother    Breast cancer Neg Hx     Social History   Social History Narrative   Not on file      Review of Systems  Constitutional:  Positive for malaise/fatigue. Negative for fever.  HENT:  Positive for sore throat. Negative for congestion.   Eyes:  Negative for blurred vision.  Respiratory:  Positive for cough. Negative for shortness of breath.   Cardiovascular:  Negative for chest pain, palpitations and leg swelling.  Gastrointestinal:  Negative for vomiting.  Musculoskeletal:  Negative for back pain.  Skin:  Negative for rash.  Neurological:  Negative for loss of consciousness and headaches.        Objective:   Vitals: Temp (!) 100.5 F (38.1 C)   LMP 03/04/2018    Physical Exam Vitals and nursing note reviewed.  Constitutional:      General: She is not in acute distress.    Appearance: Normal appearance. She is not toxic-appearing.  HENT:     Head: Normocephalic and atraumatic.  Pulmonary:     Effort: Pulmonary effort is normal.  Skin:    General: Skin is warm and dry.  Neurological:     Mental Status: She is alert and  oriented to person, place, and time. Mental status is at baseline.  Psychiatric:        Mood and Affect: Mood normal.        Behavior: Behavior normal.        Thought Content: Thought content normal.        Judgment: Judgment normal.       Results:   Studies obtained and personally reviewed by me:   Labs:       Component Value Date/Time   NA 141 02/10/2023 1120   K 5.3 02/10/2023 1120   CL 107 02/10/2023 1120   CO2 24 02/10/2023 1120   GLUCOSE 86 02/10/2023 1120   BUN 11 02/10/2023 1120   CREATININE 0.75 02/10/2023  1120   CALCIUM 9.4 02/10/2023 1120   PROT 6.5 02/10/2023 1120   ALBUMIN 4.3 12/08/2015 1122   AST 22 02/10/2023 1120   ALT 18 02/10/2023 1120   ALKPHOS 75 12/08/2015 1122   BILITOT 0.7 02/10/2023 1120   GFRNONAA 92 01/29/2020 0950   GFRAA 106 01/29/2020 0950     Lab Results  Component Value Date   WBC 4.3 01/20/2023   HGB 14.8 01/20/2023   HCT 44.5 01/20/2023   MCV 91.6 01/20/2023   PLT 217 01/20/2023    Lab Results  Component Value Date   CHOL 207 (H) 01/20/2023   HDL 83 01/20/2023   LDLCALC 108 (H) 01/20/2023   TRIG 70 01/20/2023   CHOLHDL 2.5 01/20/2023    No results found for: "HGBA1C"   Lab Results  Component Value Date   TSH 1.01 01/20/2023      Assessment & Plan:   Acute Covid-19 infection: GFR at 79 on 01/20/23. Prescribed Paxlovid regular strength take as directed, Hycodan syrup 1 tsp every 8 hours as needed for cough. Eat well, hydrate well, and walk regularly. Can take Tylenol as needed for fever. Quarantine for 5 days. Contact us if symptoms worsen or do not improve.    I,Alexander Ruley,acting as a Neurosurgeon for Margaree Mackintosh, MD.,have documented all relevant documentation on the behalf of Margaree Mackintosh, MD,as directed by  Margaree Mackintosh, MD while in the presence of Margaree Mackintosh, MD.   I, Margaree Mackintosh, MD, have reviewed all documentation for this visit. The documentation on 04/03/23 for the exam, diagnosis, procedures, and orders are all accurate and complete.

## 2023-03-24 NOTE — Telephone Encounter (Signed)
[name] [phone]  [name] called to say she has tested positive for COVID, she is feeling tired, has fever, cough, runny nose. Scheduled for video visit

## 2023-04-03 ENCOUNTER — Encounter: Payer: Self-pay | Admitting: Internal Medicine

## 2023-04-03 NOTE — Patient Instructions (Addendum)
We are sorry to hear that you have COVID-19 virus infection.  We advise quarantining for 5 days and staying well-hydrated.  We have prescribed regular strength Paxlovid to take as directed.  You have normal kidney functions.  May take Hycodan syrup 1 teaspoon every 8 hours as needed for cough.  Walk some to prevent atelectasis and stay well-hydrated.  May take Tylenol if needed for fever.  Please call if you have any questions or concerns.

## 2023-04-12 ENCOUNTER — Telehealth: Payer: BC Managed Care – PPO | Admitting: Family Medicine

## 2023-04-12 DIAGNOSIS — R051 Acute cough: Secondary | ICD-10-CM

## 2023-04-12 DIAGNOSIS — B9689 Other specified bacterial agents as the cause of diseases classified elsewhere: Secondary | ICD-10-CM | POA: Diagnosis not present

## 2023-04-12 DIAGNOSIS — J019 Acute sinusitis, unspecified: Secondary | ICD-10-CM

## 2023-04-12 MED ORDER — PROMETHAZINE-DM 6.25-15 MG/5ML PO SYRP: 5.0000 mL | ORAL_SOLUTION | Freq: Four times a day (QID) | ORAL | 0 refills | Status: DC | PRN

## 2023-04-12 MED ORDER — BENZONATATE 200 MG PO CAPS: 200.0000 mg | ORAL_CAPSULE | Freq: Two times a day (BID) | ORAL | 0 refills | Status: DC | PRN

## 2023-04-12 MED ORDER — AMOXICILLIN-POT CLAVULANATE 875-125 MG PO TABS: 1.0000 | ORAL_TABLET | Freq: Two times a day (BID) | ORAL | 0 refills | Status: AC

## 2023-04-12 NOTE — Progress Notes (Signed)
Virtual Visit Consent   [name], you are scheduled for a virtual visit with a [facility] provider today. Just as with appointments in the office, your consent must be obtained to participate. Your consent will be active for this visit and any virtual visit you may have with one of our providers in the next 365 days. If you have a MyChart account, a copy of this consent can be sent to you electronically.  As this is a virtual visit, video technology does not allow for your provider to perform a traditional examination. This may limit your provider's ability to fully assess your condition. If your provider identifies any concerns that need to be evaluated in person or the need to arrange testing (such as labs, EKG, etc.), we will make arrangements to do so. Although advances in technology are sophisticated, we cannot ensure that it will always work on either your end or our end. If the connection with a video visit is poor, the visit may have to be switched to a telephone visit. With either a video or telephone visit, we are not always able to ensure that we have a secure connection.  By engaging in this virtual visit, you consent to the provision of healthcare and authorize for your insurance to be billed (if applicable) for the services provided during this visit. Depending on your insurance coverage, you may receive a charge related to this service.  I need to obtain your verbal consent now. Are you willing to proceed with your visit today? KHAILAH BOYLE has provided verbal consent on 04/12/2023 for a virtual visit (video or telephone). Freddy Finner, NP  Date: 04/12/2023 12:46 PM  Virtual Visit via Video Note   I, Freddy Finner, connected with  TRICHIA HIEB  ([id number], 03-04-1966) on 04/12/23 at 12:45 PM EDT by a video-enabled telemedicine application and verified that I am speaking with the correct person using two identifiers.  Location: Patient: Virtual Visit Location  Patient: Home Provider: Virtual Visit Location Provider: Home Office   I discussed the limitations of evaluation and management by telemedicine and the availability of in person appointments. The patient expressed understanding and agreed to proceed.    History of Present Illness: AMALIAH STEINBECK is a 57 y.o. who identifies as a female who was assigned female at birth, and is being seen today for sinus congestion and ear pain  Onset was 04/07/2023- tired and run down- sore throat Associated symptoms are loss appetite, congestion, ear pain- right side, and cough, mild pressure in head   Modifying factors are started day nyquil meds, slept a lot, started mucinex  Denies chest pain, shortness of breath, fevers, chills  Exposure to sick contacts- unknown, recent air travel COVID test:   + this morning Vaccines:  up to date  Of note " had covid on 8/8 and was prescribed Paxlovid. Went on a trip to [place] from 8/19 - 8/23.  Returned with cold symptoms, cough, and a ringing/stuffiness in my  R ear.  More congestion this time than with Covid.  Covid test today (5 days out) is negative.     Problems:  Patient Active Problem List   Diagnosis Date Noted   History of ductal carcinoma in situ (DCIS) of breast 11/10/2017   History of therapeutic radiation 11/10/2017   Malignant neoplasm of upper-outer quadrant of left breast in female, estrogen receptor positive (HCC) 06/08/2017    Allergies: No Known Allergies Medications:  Current Outpatient Medications:  HYDROcodone bit-homatropine (HYCODAN) 5-1.5 MG/5ML syrup, Take 5 mLs by mouth every 8 (eight) hours as needed for cough., Disp: 120 mL, Rfl: 0   Meloxicam 5 MG CAPS, Take 5 mg by mouth daily as needed., Disp: 30 capsule, Rfl:    nitrofurantoin, macrocrystal-monohydrate, (MACROBID) 100 MG capsule, Take 1 capsule (100 mg total) by mouth as needed (1 capsule after intercourse). After intercourse, Disp: 30 capsule, Rfl: 3   valACYclovir  (VALTREX) 500 MG tablet, Take 1 tablet (500 mg total) by mouth 2 (two) times daily. (Patient taking differently: Take 500 mg by mouth 2 (two) times daily as needed.), Disp: 10 tablet, Rfl: 3  Observations/Objective: Patient is well-developed, well-nourished in no acute distress.  Resting comfortably  at home.  Head is normocephalic, atraumatic.  No labored breathing.  Speech is clear and coherent with logical content.  Patient is alert and oriented at baseline.  Congestion tone noted Throat clearing cough present  Assessment and Plan:    1. Acute bacterial sinusitis  - amoxicillin-clavulanate (AUGMENTIN) 875-125 MG tablet; Take 1 tablet by mouth 2 (two) times daily for 7 days.  Dispense: 14 tablet; Refill: 0 - benzonatate (TESSALON) 200 MG capsule; Take 1 capsule (200 mg total) by mouth 2 (two) times daily as needed for cough.  Dispense: 20 capsule; Refill: 0 - promethazine-dextromethorphan (PROMETHAZINE-DM) 6.25-15 MG/5ML syrup; Take 5 mLs by mouth 4 (four) times daily as needed for cough.  Dispense: 118 mL; Refill: 0  2. Acute cough  - benzonatate (TESSALON) 200 MG capsule; Take 1 capsule (200 mg total) by mouth 2 (two) times daily as needed for cough.  Dispense: 20 capsule; Refill: 0 - promethazine-dextromethorphan (PROMETHAZINE-DM) 6.25-15 MG/5ML syrup; Take 5 mLs by mouth 4 (four) times daily as needed for cough.  Dispense: 118 mL; Refill: 0  -most likely a secondary sinus infection- close to a rebound covid infection- but covid test 5 days out was negative.  -Take meds as prescribed -Rest -Use a cool mist humidifier especially during the winter months when heat dries out the air. - Use saline nose sprays frequently to help soothe nasal passages and promote drainage. -Saline irrigations of the nose can be very helpful if done frequently.             * 4X daily for 1 week*             * Use of a nettie pot can be helpful with this.  *Follow directions with this* *Boiled or  distilled water only -stay hydrated by drinking plenty of fluids - Keep thermostat turn down low to prevent drying out sinuses  - For fever or aches or pains- take tylenol or ibuprofen as directed on bottle             * for fevers greater than 101 orally you may alternate ibuprofen and tylenol every 3 hours.  If you do not improve you will need a follow up visit in person.                Reviewed side effects, risks and benefits of medication.    Patient acknowledged agreement and understanding of the plan.   Past Medical, Surgical, Social History, Allergies, and Medications have been Reviewed.    Follow Up Instructions: I discussed the assessment and treatment plan with the patient. The patient was provided an opportunity to ask questions and all were answered. The patient agreed with the plan and demonstrated an understanding of the instructions.  A copy of instructions  were sent to the patient via MyChart unless otherwise noted below.    The patient was advised to call back or seek an in-person evaluation if the symptoms worsen or if the condition fails to improve as anticipated.  Time:  I spent 10 minutes with the patient via telehealth technology discussing the above problems/concerns.    Freddy Finner, NP

## 2023-04-12 NOTE — Telephone Encounter (Signed)
[name] called back today saying that she is not feeling well again, she is wandering if it has anything to do when she had COVID a couple weeks ago, she has cold symptoms ringing in her ears, she did test negative to COVID. I have advised her to do a Mychart Video Visit.

## 2023-04-12 NOTE — Patient Instructions (Signed)
Sim Boast, thank you for joining Freddy Finner, NP for today's virtual visit.  While this provider is not your primary care provider (PCP), if your PCP is located in our provider database this encounter information will be shared with them immediately following your visit.   A [facility] MyChart account gives you access to today's visit and all your visits, tests, and labs performed at [facility] " click here if you don't have a [facility] MyChart account or go to [email or web address]  Consent: (Patient) [name] provided verbal consent for this virtual visit at the beginning of the encounter.  Current Medications:  Current Outpatient Medications:    amoxicillin-clavulanate (AUGMENTIN) 875-125 MG tablet, Take 1 tablet by mouth 2 (two) times daily for 7 days., Disp: 14 tablet, Rfl: 0   benzonatate (TESSALON) 200 MG capsule, Take 1 capsule (200 mg total) by mouth 2 (two) times daily as needed for cough., Disp: 20 capsule, Rfl: 0   promethazine-dextromethorphan (PROMETHAZINE-DM) 6.25-15 MG/5ML syrup, Take 5 mLs by mouth 4 (four) times daily as needed for cough., Disp: 118 mL, Rfl: 0   Meloxicam 5 MG CAPS, Take 5 mg by mouth daily as needed., Disp: 30 capsule, Rfl:    nitrofurantoin, macrocrystal-monohydrate, (MACROBID) 100 MG capsule, Take 1 capsule (100 mg total) by mouth as needed (1 capsule after intercourse). After intercourse, Disp: 30 capsule, Rfl: 3   valACYclovir (VALTREX) 500 MG tablet, Take 1 tablet (500 mg total) by mouth 2 (two) times daily. (Patient taking differently: Take 500 mg by mouth 2 (two) times daily as needed.), Disp: 10 tablet, Rfl: 3   Medications ordered in this encounter:  Meds ordered this encounter  Medications   amoxicillin-clavulanate (AUGMENTIN) 875-125 MG tablet    Sig: Take 1 tablet by mouth 2 (two) times daily for 7 days.    Dispense:  14 tablet    Refill:  0    Order Specific Question:   Supervising Provider    Answer:    Merrilee Jansky [[id number]]   benzonatate (TESSALON) 200 MG capsule    Sig: Take 1 capsule (200 mg total) by mouth 2 (two) times daily as needed for cough.    Dispense:  20 capsule    Refill:  0    Order Specific Question:   Supervising Provider    Answer:   Merrilee Jansky [id number]   promethazine-dextromethorphan (PROMETHAZINE-DM) 6.25-15 MG/5ML syrup    Sig: Take 5 mLs by mouth 4 (four) times daily as needed for cough.    Dispense:  118 mL    Refill:  0    Order Specific Question:   Supervising Provider    Answer:   Merrilee Jansky [[id number]]     *If you need refills on other medications prior to your next appointment, please contact your pharmacy*  Follow-Up: Call back or seek an in-person evaluation if the symptoms worsen or if the condition fails to improve as anticipated.  [facility] [phone]  Other Instructions  -Take meds as prescribed -Rest -Use a cool mist humidifier especially during the winter months when heat dries out the air. - Use saline nose sprays frequently to help soothe nasal passages and promote drainage. -Saline irrigations of the nose can be very helpful if done frequently.             * 4X daily for 1 week*             * Use of a nettie  pot can be helpful with this.  *Follow directions with this* *Boiled or distilled water only -stay hydrated by drinking plenty of fluids - Keep thermostat turn down low to prevent drying out sinuses  - For fever or aches or pains- take tylenol or ibuprofen as directed on bottle             * for fevers greater than 101 orally you may alternate ibuprofen and tylenol every 3 hours.  If you do not improve you will need a follow up visit in person.                    If you have been instructed to have an in-person evaluation today at a local Urgent Care facility, please use the link below. It will take you to a list of all of our available [facility], including address, phone  number and hours of operation. Please do not delay care.  [facility]  If you or a family member do not have a primary care provider, use the link below to schedule a visit and establish care. When you choose a [facility] primary care physician or advanced practice provider, you gain a long-term partner in health. Find a Primary Care Provider  Learn more about [facility]'s in-office and virtual care options:  - Get Care Now

## 2023-04-14 NOTE — Telephone Encounter (Signed)
Patient did follow thru with My Chart video visit

## 2023-05-20 ENCOUNTER — Other Ambulatory Visit: Payer: Self-pay | Admitting: Obstetrics and Gynecology

## 2023-05-20 DIAGNOSIS — Z1231 Encounter for screening mammogram for malignant neoplasm of breast: Secondary | ICD-10-CM

## 2023-06-21 ENCOUNTER — Other Ambulatory Visit: Payer: Self-pay | Admitting: Nurse Practitioner

## 2023-06-21 DIAGNOSIS — R109 Unspecified abdominal pain: Secondary | ICD-10-CM | POA: Diagnosis not present

## 2023-06-21 DIAGNOSIS — K5900 Constipation, unspecified: Secondary | ICD-10-CM | POA: Diagnosis not present

## 2023-06-24 ENCOUNTER — Ambulatory Visit
Admission: RE | Admit: 2023-06-24 | Discharge: 2023-06-24 | Disposition: A | Discharge status: Home / Self Care | Payer: BC Managed Care – PPO | Source: Ambulatory Visit | Attending: Nurse Practitioner | Admitting: Nurse Practitioner

## 2023-06-24 DIAGNOSIS — K769 Liver disease, unspecified: Secondary | ICD-10-CM | POA: Diagnosis not present

## 2023-06-24 DIAGNOSIS — R109 Unspecified abdominal pain: Secondary | ICD-10-CM | POA: Diagnosis not present

## 2023-07-12 DIAGNOSIS — M25511 Pain in right shoulder: Secondary | ICD-10-CM | POA: Diagnosis not present

## 2023-07-22 ENCOUNTER — Ambulatory Visit
Admission: RE | Admit: 2023-07-22 | Discharge: 2023-07-22 | Disposition: A | Discharge status: Home / Self Care | Payer: BC Managed Care – PPO | Source: Ambulatory Visit | Attending: Obstetrics and Gynecology | Admitting: Obstetrics and Gynecology

## 2023-07-22 DIAGNOSIS — Z1231 Encounter for screening mammogram for malignant neoplasm of breast: Secondary | ICD-10-CM | POA: Diagnosis not present

## 2023-08-02 DIAGNOSIS — R3915 Urgency of urination: Secondary | ICD-10-CM | POA: Diagnosis not present

## 2023-08-02 DIAGNOSIS — R1031 Right lower quadrant pain: Secondary | ICD-10-CM | POA: Diagnosis not present

## 2023-08-04 DIAGNOSIS — R1031 Right lower quadrant pain: Secondary | ICD-10-CM | POA: Diagnosis not present

## 2023-08-18 ENCOUNTER — Telehealth: Payer: Self-pay | Admitting: Internal Medicine

## 2023-08-18 NOTE — Telephone Encounter (Signed)
 Called to talk with patient to see what she was needing, because I could not understand the CRM message. She was wanting to go ahead and have the CT Cardiac Scoring Test, so I fixed it where it was still active and gave her phone number to call and schedule.

## 2023-08-18 NOTE — Telephone Encounter (Signed)
 Copied from CRM [id number]. Topic: Clinical - Medical Advice >> Aug 18, 2023  2:29 PM Carlatta H wrote: Reason for CRM: Please call patient to advised of where she needs to go for check imaging//She stated he wasn't [facility]

## 2023-08-19 ENCOUNTER — Other Ambulatory Visit: Payer: Self-pay

## 2023-08-19 DIAGNOSIS — Z136 Encounter for screening for cardiovascular disorders: Secondary | ICD-10-CM

## 2023-08-19 NOTE — Telephone Encounter (Signed)
 Copied from CRM [id number]. Topic: Referral - Status >> Aug 19, 2023  1:19 PM Fredrica W wrote: Reason for CRM:  Pt called to leave a message for Apolinar that order was still not received by facility for scan for calcium  in heart. Per last note it had been corrected but patient states they still do not have it in for her to schedule an appt. Thank You

## 2023-08-19 NOTE — Telephone Encounter (Signed)
 Called Donnette back to let her know we have gotten order fixed and she can call and schedule appointment now.

## 2023-09-09 DIAGNOSIS — Z853 Personal history of malignant neoplasm of breast: Secondary | ICD-10-CM | POA: Diagnosis not present

## 2023-09-09 DIAGNOSIS — N651 Disproportion of reconstructed breast: Secondary | ICD-10-CM | POA: Diagnosis not present

## 2023-09-29 ENCOUNTER — Ambulatory Visit (HOSPITAL_COMMUNITY)
Admission: RE | Admit: 2023-09-29 | Discharge: 2023-09-29 | Disposition: A | Discharge status: Home / Self Care | Payer: Self-pay | Source: Ambulatory Visit | Attending: Internal Medicine | Admitting: Internal Medicine

## 2023-09-29 DIAGNOSIS — Z136 Encounter for screening for cardiovascular disorders: Secondary | ICD-10-CM | POA: Insufficient documentation

## 2023-09-30 ENCOUNTER — Telehealth: Payer: Self-pay | Admitting: Internal Medicine

## 2023-09-30 NOTE — Telephone Encounter (Signed)
Patient is wanting to discuss results, looks like to me all the results are not in yet. I can call and let her know.

## 2023-09-30 NOTE — Telephone Encounter (Signed)
Called patient and scheduled an appointment later next week when the rest of the results come in.

## 2023-09-30 NOTE — Telephone Encounter (Signed)
Copied from CRM [id number]. Topic: Clinical - Lab/Test Results >> Sep 30, 2023  9:07 AM Patsy Lager T wrote: Reason for CRM: patient called to speak with her provider about her CT Scan results. Patient stated she will not be available between 10-11am.

## 2023-09-30 NOTE — Telephone Encounter (Signed)
Copied from CRM [id number]. Topic: General - Other >> Sep 30, 2023 11:20 AM Prudencio Pair wrote: Reason for CRM: Patient returning call from nurse. Provided patient with results per note left in chart. Patient states she will wait until full report comes back & she will schedule appt. Also states that she would like to see a cardiologists as well. Patient stated nurse can give her a call back as she has other questions and concerns.

## 2023-09-30 NOTE — Telephone Encounter (Signed)
Scheduled appointment

## 2023-10-06 ENCOUNTER — Emergency Department (HOSPITAL_BASED_OUTPATIENT_CLINIC_OR_DEPARTMENT_OTHER): Payer: BC Managed Care – PPO | Admitting: Radiology

## 2023-10-06 ENCOUNTER — Encounter (HOSPITAL_BASED_OUTPATIENT_CLINIC_OR_DEPARTMENT_OTHER): Payer: Self-pay | Admitting: Emergency Medicine

## 2023-10-06 ENCOUNTER — Ambulatory Visit: Payer: Self-pay | Admitting: Internal Medicine

## 2023-10-06 ENCOUNTER — Other Ambulatory Visit: Payer: Self-pay

## 2023-10-06 ENCOUNTER — Emergency Department (HOSPITAL_BASED_OUTPATIENT_CLINIC_OR_DEPARTMENT_OTHER)
Admission: EM | Admit: 2023-10-06 | Discharge: 2023-10-06 | Disposition: A | Discharge status: Home / Self Care | Payer: BC Managed Care – PPO | Attending: Emergency Medicine | Admitting: Emergency Medicine

## 2023-10-06 DIAGNOSIS — Z85828 Personal history of other malignant neoplasm of skin: Secondary | ICD-10-CM | POA: Diagnosis not present

## 2023-10-06 DIAGNOSIS — L570 Actinic keratosis: Secondary | ICD-10-CM | POA: Diagnosis not present

## 2023-10-06 DIAGNOSIS — L814 Other melanin hyperpigmentation: Secondary | ICD-10-CM | POA: Diagnosis not present

## 2023-10-06 DIAGNOSIS — Z853 Personal history of malignant neoplasm of breast: Secondary | ICD-10-CM | POA: Diagnosis not present

## 2023-10-06 DIAGNOSIS — R079 Chest pain, unspecified: Secondary | ICD-10-CM | POA: Diagnosis not present

## 2023-10-06 DIAGNOSIS — D2262 Melanocytic nevi of left upper limb, including shoulder: Secondary | ICD-10-CM | POA: Diagnosis not present

## 2023-10-06 DIAGNOSIS — R0789 Other chest pain: Secondary | ICD-10-CM | POA: Diagnosis not present

## 2023-10-06 LAB — BASIC METABOLIC PANEL
Anion gap: 7 (ref 5–15)
BUN: 14 mg/dL (ref 6–20)
CO2: 28 mmol/L (ref 22–32)
Calcium: 9.5 mg/dL (ref 8.9–10.3)
Chloride: 106 mmol/L (ref 98–111)
Creatinine, Ser: 0.85 mg/dL (ref 0.44–1.00)
GFR, Estimated: >60 mL/min (ref >60)
Glucose, Bld: 96 mg/dL (ref 70–99)
Potassium: 3.7 mmol/L (ref 3.5–5.1)
Sodium: 141 mmol/L (ref 135–145)

## 2023-10-06 LAB — CBC
HCT: 44.4 % (ref 36.0–46.0)
Hemoglobin: 15.2 g/dL — ABNORMAL HIGH (ref 12.0–15.0)
MCH: 31.1 pg (ref 26.0–34.0)
MCHC: 34.2 g/dL (ref 30.0–36.0)
MCV: 91 fL (ref 80.0–100.0)
Platelets: 227 10*3/uL (ref 150–400)
RBC: 4.88 MIL/uL (ref 3.87–5.11)
RDW: 11.9 % (ref 11.5–15.5)
WBC: 5.9 10*3/uL (ref 4.0–10.5)
nRBC: 0 % (ref 0.0–0.2)

## 2023-10-06 LAB — TROPONIN I (HIGH SENSITIVITY)
Troponin I (High Sensitivity): <2 ng/L (ref <18)
Troponin I (High Sensitivity): <2 ng/L (ref <18)

## 2023-10-06 LAB — D-DIMER, QUANTITATIVE: D-Dimer, Quant: <0.27 ug{FEU}/mL (ref 0.00–0.50)

## 2023-10-06 NOTE — Discharge Instructions (Signed)
Try taking over-the-counter medications such as ibuprofen or Tylenol to see if that helps with the pain and discomfort.  The test today in the ED were reassuring.  No signs of heart injury.  No findings to suggest blood clot or pneumonia.  Follow-up with your primary care doctor as planned to be rechecked.

## 2023-10-06 NOTE — ED Triage Notes (Signed)
C/o central cp that rads into back x 3 days. Also states she has had some increased anxiety over the last couple of days.

## 2023-10-06 NOTE — Telephone Encounter (Signed)
  Chief Complaint: chest pain Symptoms: left upper chest pain, left upper back pain, mild SOB, anxiety Frequency: back pain x 3-4 days, chest pain constant dull ache, pressure x 2-3 days Pertinent Negatives: Patient denies nausea, vomiting, sweating, dizziness, fever, cough, chest pain radiating to neck/jaw/arm. Disposition: [x] ED /[] Urgent Care (no appt availability in office) / [] Appointment(In office/virtual)/ []  [name]/ [] Home Care/ [] Refused Recommended Disposition /[] [facility]/ []  Follow-up with PCP Additional Notes: Patient originally calling in to ask if she can move her appointment with PCP up to today. Patient states over the past few days she has been experiencing chest and back pain with SOB. Patient states she thinks she either pulled a muscle at [employer] or it is anxiety related. Due to symptoms, advised ED and patient is agreeable.   Copied from CRM [id number]. Topic: Clinical - Red Word Triage >> Oct 06, 2023 12:37 PM Tiffany B wrote: Red Word that prompted transfer to Nurse Triage: Pulled muscle experiencing chest pain and out of breath. Reason for Disposition  [1] Chest pain lasts > 5 minutes AND [2] age > 6  Answer Assessment - Initial Assessment Questions 1. LOCATION: "Where does it hurt?"       Left upper chest above breast and below neck.  2. RADIATION: "Does the pain go anywhere else?" (e.g., into neck, jaw, arms, back)     Radiates to same are on left side of upper back.  3. ONSET: "When did the chest pain begin?" (Minutes, hours or days)      She states her back pain x 3-4 days ago chest pain x 2-3 days.  4. PATTERN: "Does the pain come and go, or has it been constant since it started?"  "Does it get worse with exertion?"      Improved with Tylenol, dull ache that is constant and has been present for over 15 minutes.  5. DURATION: "How long does it last" (e.g., seconds, minutes, hours)     Dull ache is constant.  6. SEVERITY: "How bad  is the pain?"  (e.g., Scale 1-10; mild, moderate, or severe)    - MILD (1-3): doesn't interfere with normal activities     - MODERATE (4-7): interferes with normal activities or awakens from sleep    - SEVERE (8-10): excruciating pain, unable to do any normal activities       Feels like a heaviness on her chest about a 3-4/10.  7. CARDIAC RISK FACTORS: "Do you have any history of heart problems or risk factors for heart disease?" (e.g., angina, prior heart attack; diabetes, high blood pressure, high cholesterol, smoker, or strong family history of heart disease)     Patient states she doesn't have any history. She states her cholesterol might be high and she is concerned about her cardiac score.  8. PULMONARY RISK FACTORS: "Do you have any history of lung disease?"  (e.g., blood clots in lung, asthma, emphysema, birth control pills)     Denies.  9. CAUSE: "What do you think is causing the chest pain?"     Patient unsure if it could be related to Pilates, she thought maybe she pulled a muscle. Patient states it could also be anxiety about her appointment tomorrow with PCP to discuss her results.  10. OTHER SYMPTOMS: "Do you have any other symptoms?" (e.g., dizziness, nausea, vomiting, sweating, fever, difficulty breathing, cough)       Mild SOB.  Protocols used: Chest Pain-A-AH

## 2023-10-06 NOTE — ED Provider Notes (Signed)
[place] EMERGENCY DEPARTMENT AT [facility] Provider Note   CSN: [id number] Arrival date & time: 10/06/23  1312     History  Chief Complaint  Patient presents with   Chest Pain    [name] is a 58 y.o. female.   Chest Pain  Patient has a history of anxiety breast cancer  Pt started having pain in the upper back and front of her chest.  It started a few days ago.  It has been mostly constant.  Heavy in the front and more constant dull in the back.  Pressing in one area has increased the symptoms.  Pt also has felt short of breath.  No cough.  No fever.    Home Medications Prior to Admission medications   Medication Sig Start Date End Date Taking? Authorizing Provider  benzonatate (TESSALON) 200 MG capsule Take 1 capsule (200 mg total) by mouth 2 (two) times daily as needed for cough. 04/12/23   Freddy Finner, NP  Meloxicam 5 MG CAPS Take 5 mg by mouth daily as needed. 07/31/21   Serena Croissant, MD  nitrofurantoin, macrocrystal-monohydrate, (MACROBID) 100 MG capsule Take 1 capsule (100 mg total) by mouth as needed (1 capsule after intercourse). After intercourse 11/04/22   Margaree Mackintosh, MD  promethazine-dextromethorphan (PROMETHAZINE-DM) 6.25-15 MG/5ML syrup Take 5 mLs by mouth 4 (four) times daily as needed for cough. 04/12/23   Freddy Finner, NP  valACYclovir (VALTREX) 500 MG tablet Take 1 tablet (500 mg total) by mouth 2 (two) times daily. Patient taking differently: Take 500 mg by mouth 2 (two) times daily as needed. 02/07/20   Margaree Mackintosh, MD      Allergies    Patient has no known allergies.    Review of Systems   Review of Systems  Cardiovascular:  Positive for chest pain.    Physical Exam Updated Vital Signs BP 99/77   Pulse 62   Temp 97.9 F (36.6 C)   Resp 19   Ht 1.651 m (5\' 5" )   Wt 63.5 kg   LMP 03/04/2018   SpO2 100%   BMI 23.30 kg/m  Physical Exam Vitals and nursing note reviewed.  Constitutional:      General: She is not  in acute distress.    Appearance: She is well-developed.  HENT:     Head: Normocephalic and atraumatic.     Right Ear: External ear normal.     Left Ear: External ear normal.  Eyes:     General: No scleral icterus.       Right eye: No discharge.        Left eye: No discharge.     Conjunctiva/sclera: Conjunctivae normal.  Neck:     Trachea: No tracheal deviation.  Cardiovascular:     Rate and Rhythm: Normal rate and regular rhythm.  Pulmonary:     Effort: Pulmonary effort is normal. No respiratory distress.     Breath sounds: Normal breath sounds. No stridor. No wheezing or rales.  Abdominal:     General: Bowel sounds are normal. There is no distension.     Palpations: Abdomen is soft.     Tenderness: There is no abdominal tenderness. There is no guarding or rebound.  Musculoskeletal:        General: No tenderness or deformity.     Cervical back: Neck supple.     Comments: Mild tenderness to palpation left periscapular region, no erythema or swelling  Skin:    General: Skin  is warm and dry.     Findings: No rash.  Neurological:     General: No focal deficit present.     Mental Status: She is alert.     Cranial Nerves: No cranial nerve deficit, dysarthria or facial asymmetry.     Sensory: No sensory deficit.     Motor: No abnormal muscle tone or seizure activity.     Coordination: Coordination normal.  Psychiatric:        Mood and Affect: Mood normal.     ED Results / Procedures / Treatments   Labs (all labs ordered are listed, but only abnormal results are displayed) Labs Reviewed  CBC - Abnormal; Notable for the following components:      Result Value   Hemoglobin 15.2 (*)    All other components within normal limits  BASIC METABOLIC PANEL  D-DIMER, QUANTITATIVE (NOT AT [facility])  TROPONIN I (HIGH SENSITIVITY)  TROPONIN I (HIGH SENSITIVITY)    EKG EKG Interpretation Date/Time:  Thursday October 06 2023 13:26:14 EST Ventricular Rate:  90 PR Interval:  118 QRS  Duration:  84 QT Interval:  330 QTC Calculation: 403 R Axis:   88  Text Interpretation: Normal sinus rhythm Normal ECG No previous ECGs available Confirmed by Linwood Dibbles [phone]) on 10/06/2023 4:55:04 PM  Radiology DG Chest 2 View Result Date: 10/06/2023 CLINICAL DATA:  Central chest pain. EXAM: CHEST - 2 VIEW COMPARISON:  Cardiac CT scan from 02/30/2025. FINDINGS: Apparent increased attenuation overlying the right mid lower lung zone corresponds to right-sided breast implant. Bilateral lungs are otherwise clear. No dense consolidation or lung collapse. Bilateral costophrenic angles are clear. Normal cardio-mediastinal silhouette. No acute osseous abnormalities. The soft tissues are within normal limits. There are surgical staples overlying the left breast region, from prior surgery. IMPRESSION: No active cardiopulmonary disease. Electronically Signed   By: Jules Schick M.D.   On: 10/06/2023 13:45    Procedures Procedures    Medications Ordered in ED Medications - No data to display  ED Course/ Medical Decision Making/ A&P Clinical Course as of 10/06/23 1705  Thu Oct 06, 2023  1552 Labs reviewed, CBC metabolic panel normal [JK]  1552 Troponin I (High Sensitivity) Normal [JK]  1552 Chest x-ray without acute findings [JK]  1654 Second troponin normal D-dimer normal [JK]    Clinical Course User Index [JK] Linwood Dibbles, MD             HEART Score: 2                Geneva (Revised) Score: 0, Geneva Score Interpretation: Low Risk Group: 7-9% incidence of pulmonary embolism from several studies   Medical Decision Making Problems Addressed: Chest pain, unspecified type: acute illness or injury that poses a threat to life or bodily functions  Amount and/or Complexity of Data Reviewed Labs: ordered. Decision-making details documented in ED Course. Radiology: ordered and independent interpretation performed.   Patient presented to the ED for evaluation of chest pain.  Overall low risk  heart score.  Serial cardiac enzymes and EKG are reassuring.  Doubt ACS.  Low risk Geneva score for PE.  D-dimer negative.  Patient is not having any shortness of breath or leg swelling.  Doubt pulmonary embolism.  Etiology unclear but it is possible this could be musculoskeletal.  She does have some slight tenderness in the periscapular region.  Patient states she has been doing some new exercises.  Overall appears appropriate for outpatient follow-up with PCP.  Recommend short course  of either Tylenol or NSAIDs.        Final Clinical Impression(s) / ED Diagnoses Final diagnoses:  Chest pain, unspecified type    Rx / DC Orders ED Discharge Orders     None         Linwood Dibbles, MD 10/06/23 1705

## 2023-10-07 ENCOUNTER — Ambulatory Visit (INDEPENDENT_AMBULATORY_CARE_PROVIDER_SITE_OTHER): Payer: BC Managed Care – PPO | Admitting: Internal Medicine

## 2023-10-07 ENCOUNTER — Encounter: Payer: Self-pay | Admitting: Internal Medicine

## 2023-10-07 VITALS — BP 102/78 | HR 70 | Ht 65.0 in | Wt 143.0 lb

## 2023-10-07 DIAGNOSIS — F419 Anxiety disorder, unspecified: Secondary | ICD-10-CM | POA: Diagnosis not present

## 2023-10-07 DIAGNOSIS — Z86000 Personal history of in-situ neoplasm of breast: Secondary | ICD-10-CM | POA: Diagnosis not present

## 2023-10-07 DIAGNOSIS — R0789 Other chest pain: Secondary | ICD-10-CM | POA: Diagnosis not present

## 2023-10-07 MED ORDER — ALPRAZOLAM 0.25 MG PO TABS: 0.2500 mg | ORAL_TABLET | Freq: Three times a day (TID) | ORAL | 0 refills | Status: DC | PRN

## 2023-10-07 NOTE — Progress Notes (Signed)
 Patient Care Team: Margaree Mackintosh, MD as PCP - General (Internal Medicine) Serena Croissant, MD as Consulting Physician (Hematology and Oncology) Emelia Loron, MD as Consulting Physician (General Surgery) Lonie Peak, MD as Attending Physician (Radiation Oncology) Loa Socks, NP as Nurse Practitioner (Hematology and Oncology) Glenna Fellows, MD as Consulting Physician (Plastic Surgery)  Visit Date: 10/07/23  Subjective:   Chief Complaint  Patient presents with   CT scan   BP Readings from Last 1 Encounters:  10/07/23 102/78   Patient [name] J Prowell,Female DOB:Oct 18, 1965,58 y.o. ION:[id number]   58 y.o. Female presents today for office visit with Coronary Cardiac Scoring Results. Patient has a past medical history of Anxiety and Breast Cancer w/ hx of Radiation Therapy. This visit was initially scheduled to discuss her CT Cardiac Score on 2/13, which was 106, however yesterday she presented to [facility] at [facility] for chest pains. B-met normal, CBC with Hgb slightly elevated at 15.2 but otherwise wnl, Troponins I normal x2, EKG normal, CXR normal, and D-dimer normal. Diagnosed with unspecified chest pain and discharged to defer to our office today. She reports today that her chest pain was likely due to her having pulled a muscle while doing pilates and then she began to have anxiety due to her recent cardiac scoring.   Past Medical History:  Diagnosis Date   Anxiety    Breast cancer (HCC)    Breast mass 03/26/2017   3 oclock x 1 week    Cancer (HCC) 04/2017   left breast cancer   History of radiation therapy 07/18/17- 09/02/17   Left Breast 50.4 Gy in 28 fractions, Left Breast Boost 10 Gy in 5 fractions.    Personal history of radiation therapy     No Known Allergies  Family History  Problem Relation Age of Onset   Myelodysplastic syndrome Father    Heart disease Maternal Grandmother    Healthy Mother    Breast cancer Neg Hx    Social Hx: married.  Has adult children from first marriage. Nonsmoker. Social alcohol consumption.  Review of Systems  Cardiovascular:  Positive for chest pain (chest wall).  All other systems reviewed and are negative.    Objective:  Vitals: BP 102/78   Pulse 70   Ht 5\' 5"  (1.651 m)   Wt 143 lb (64.9 kg)   LMP 03/04/2018   SpO2 98%   BMI 23.80 kg/m   Physical Exam Vitals and nursing note reviewed.  Constitutional:      General: She is not in acute distress.    Appearance: Normal appearance. She is not toxic-appearing.  HENT:     Head: Normocephalic and atraumatic.  Cardiovascular:     Rate and Rhythm: Normal rate and regular rhythm. No extrasystoles are present.    Pulses: Normal pulses.     Heart sounds: Normal heart sounds. No murmur heard.    No friction rub. No gallop.  Pulmonary:     Effort: Pulmonary effort is normal. No respiratory distress.     Breath sounds: Normal breath sounds. No wheezing or rales.  Chest:     Chest wall: Tenderness present.  Musculoskeletal:     Thoracic back: Tenderness (sub-scapular) present.  Skin:    General: Skin is warm and dry.  Neurological:     Mental Status: She is alert and oriented to person, place, and time. Mental status is at baseline.  Psychiatric:        Mood and Affect: Mood normal.  Behavior: Behavior normal.        Thought Content: Thought content normal.        Judgment: Judgment normal.     Results:  Studies Obtained And Personally Reviewed By Me:  Coronary Calcium Score 09/29/2023 15:00  FINDINGS: Non-cardiac: No significant non cardiac findings on limited lung and soft tissue windows.    Ascending Aorta: Normal diameter 3.2 cm   Pericardium: Normal   Coronary arteries: Calcium noted in LAD/LCX   LM 0   LAD 88.5   LCX 17.6   RCA 0   Total 106   IMPRESSION: Coronary calcium score of 106. This was 7 nd percentile for age and sex matched control.   EKG Date/Time Thursday October 06 2023 13:26:14 EST   Ventricular Rate 90  PR Interval 118  QRS Duration 84  QT Interval 330  QTC Calculation 403  R Axis 88  Text Interpretation Normal sinus rhythm Normal ECG   CHEST - 2 VIEW 10/06/2023 13:35  COMPARISON:  Cardiac CT scan from 09/29/2023.   FINDINGS: Apparent increased attenuation overlying the right mid lower lung zone corresponds to right-sided breast implant. Bilateral lungs are otherwise clear. No dense consolidation or lung collapse. Bilateral costophrenic angles are clear.   Normal cardio-mediastinal silhouette.   No acute osseous abnormalities.   The soft tissues are within normal limits.   There are surgical staples overlying the left breast region, from prior surgery.   IMPRESSION: No active cardiopulmonary disease.   Labs:     Component Value Date/Time   NA 141 10/06/2023 1326   K 3.7 10/06/2023 1326   CL 106 10/06/2023 1326   CO2 28 10/06/2023 1326   GLUCOSE 96 10/06/2023 1326   BUN 14 10/06/2023 1326   CREATININE 0.85 10/06/2023 1326   CREATININE 0.75 02/10/2023 1120   CALCIUM 9.5 10/06/2023 1326   PROT 6.5 02/10/2023 1120   ALBUMIN 4.3 12/08/2015 1122   AST 22 02/10/2023 1120   ALT 18 02/10/2023 1120   ALKPHOS 75 12/08/2015 1122   BILITOT 0.7 02/10/2023 1120   GFRNONAA >60 10/06/2023 1326   GFRNONAA 92 01/29/2020 0950   GFRAA 106 01/29/2020 0950    Lab Results  Component Value Date   WBC 5.9 10/06/2023   HGB 15.2 (H) 10/06/2023   HCT 44.4 10/06/2023   MCV 91.0 10/06/2023   PLT 227 10/06/2023   Lab Results  Component Value Date   CHOL 207 (H) 01/20/2023   HDL 83 01/20/2023   LDLCALC 108 (H) 01/20/2023   TRIG 70 01/20/2023   CHOLHDL 2.5 01/20/2023   Lab Results  Component Value Date   TSH 1.01 01/20/2023    Assessment & Plan:   CT Cardiac Scoring on 2/13, which was 106. Patient has anxiety about heart health as first husband passed away from a cardiac event. Patient will benefit from cardiology evaluation even though her coronary  calcium score is good.She exercises regularly and stays in shape.  Follow-up Encounter for Chest Pain: from ED yesterday. Agree with ED disposition that this is likely musculoskeletal, from chest wall tenderness noted on exam. Also some muscular tightness sub-scapular, recommended to apply ice to alleviate this.   Anxiety: sending in 0.25 mg Xanax - take 1 tablet (0.25 mg total) by mouth 3 (three) times daily as needed for anxiety.   Hx breast cancer in remission  I,Emily Lagle,acting as a scribe for Margaree Mackintosh, MD.,have documented all relevant documentation on the behalf of Margaree Mackintosh, MD,as directed by  Margaree Mackintosh, MD while in the presence of Margaree Mackintosh, MD.   I, Margaree Mackintosh, MD, have reviewed all documentation for this visit. The documentation on 10/12/23 for the exam, diagnosis, procedures, and orders are all accurate and complete.

## 2023-10-10 DIAGNOSIS — Z01419 Encounter for gynecological examination (general) (routine) without abnormal findings: Secondary | ICD-10-CM | POA: Diagnosis not present

## 2023-10-12 NOTE — Patient Instructions (Addendum)
 Referral to [facility] to discuss coronary calcium study and have assessment for possible coronary disease. Patient is anxious about another chest pain event and needs reassurance. Hx of breast cancer and she lost first husband to coronary event.

## 2023-11-21 ENCOUNTER — Telehealth (INDEPENDENT_AMBULATORY_CARE_PROVIDER_SITE_OTHER): Admitting: Internal Medicine

## 2023-11-21 ENCOUNTER — Encounter: Payer: Self-pay | Admitting: Internal Medicine

## 2023-11-21 DIAGNOSIS — J069 Acute upper respiratory infection, unspecified: Secondary | ICD-10-CM | POA: Diagnosis not present

## 2023-11-21 MED ORDER — BENZONATATE 100 MG PO CAPS: 100.0000 mg | ORAL_CAPSULE | Freq: Three times a day (TID) | ORAL | 0 refills | Status: DC | PRN

## 2023-11-21 MED ORDER — PREDNISONE 10 MG PO TABS: ORAL_TABLET | ORAL | 0 refills | Status: DC

## 2023-11-21 MED ORDER — FLUCONAZOLE 150 MG PO TABS: 150.0000 mg | ORAL_TABLET | Freq: Once | ORAL | 0 refills | Status: AC

## 2023-11-21 MED ORDER — AMOXICILLIN-POT CLAVULANATE 875-125 MG PO TABS: 1.0000 | ORAL_TABLET | Freq: Two times a day (BID) | ORAL | 0 refills | Status: DC

## 2023-11-21 MED ORDER — BENZONATATE 100 MG PO CAPS: 100.0000 mg | ORAL_CAPSULE | Freq: Three times a day (TID) | ORAL | 0 refills | Status: AC | PRN

## 2023-11-21 NOTE — Patient Instructions (Addendum)
 We are sorry you are not feeling well today.  You have been diagnosed after virtual visit with an acute upper respiratory infection.  Please take Augmentin 875/125 tablets every 12 hours for 10 days for respiratory congestion which may be developing into sinusitis.  Please take prednisone 10 mg (number 21 tablets) to take and tapering course as directed starting with 6 tablets day 1 and decreasing by 1 tablet daily i.e. 6-5-4-3-2-1 taper.  May take Diflucan 150 mg tablet if needed for Candida vaginitis while on antibiotics.  Rest and stay well-hydrated.  Monitor temp.  Walk some to prevent atelectasis.  Contact us if not improving in 2 to 3 days or sooner if worse.

## 2023-11-21 NOTE — Progress Notes (Signed)
 Patient Care Team: Margaree Mackintosh, MD as PCP - General (Internal Medicine) Serena Croissant, MD as Consulting Physician (Hematology and Oncology) Emelia Loron, MD as Consulting Physician (General Surgery) Lonie Peak, MD as Attending Physician (Radiation Oncology) Loa Socks, NP as Nurse Practitioner (Hematology and Oncology) Glenna Fellows, MD as Consulting Physician (Plastic Surgery)  I connected with [name] on 11/21/23 at 9:22 AM by video enabled telemedicine visit and verified that I am speaking with the correct person using two identifiers, myself and [name], CMA. I am in my office and patient is in their home.    I discussed the limitations, risks, security and privacy concerns of performing an evaluation and management service by telemedicine and the availability of in-person appointments. I also discussed with the patient that there may be a patient responsible charge related to this service. The patient expressed understanding and agreed to proceed.   Other persons participating in the visit and their role in the encounter: Medical scribe, [name]  Patient's location: Home  Provider's location: Clinic   I provided 20 minutes of time spent during this encounter, including chart review, interviewing patient, medical decision making, and e-scribing medication with > 50% was spent counseling as documented under my assessment & plan.   Chief Complaint  Patient presents with   Cough   nasal and chest congestion    Did covid test and was neg.  Start feeling bad last thursday   Subjective:  Patient [name], [name] DOB:02-28-1966,57 y.o. QMV:[id number]   58 y.o. Female presents today for acute sick visit with Cough; Nasal/Chest Congestion. Says that recently she went to [place] for a week and after returning her husband became ill with respiratory symptoms. She was starting to feel some respiratory  symptoms also but was  taking Dayquil and Zicam for symptoms. She subsequently went back out of town again and when she came back  last Thursday her symptoms had worsened. Denies fever/chills or N/V/D. Past Medical History:  Diagnosis Date   Anxiety    Breast cancer (HCC)    Breast mass 03/26/2017   3 oclock x 1 week    Cancer (HCC) 04/2017   left breast cancer   History of radiation therapy 07/18/17- 09/02/17   Left Breast 50.4 Gy in 28 fractions, Left Breast Boost 10 Gy in 5 fractions.    Personal history of radiation therapy     Family History  Problem Relation Age of Onset   Myelodysplastic syndrome Father    Heart disease Maternal Grandmother    Healthy Mother    Breast cancer Neg Hx    Social Hx: reviewed and unchanged.  Review of Systems  Constitutional:  Negative for chills and fever.  HENT:  Positive for congestion (nasal/chest).   Respiratory:  Positive for cough.   Gastrointestinal:  Negative for diarrhea, nausea and vomiting.     Objective:  Vitals: LMP 03/04/2018  Physical Exam Vitals and nursing note reviewed.  Constitutional:      General: She is not in acute distress.    Appearance: Normal appearance. She is not toxic-appearing.  HENT:     Head: Normocephalic and atraumatic.  Pulmonary:     Effort: Pulmonary effort is normal.     Comments: Sounds mildly congested nasally Skin:    General: Skin is warm and dry.  Neurological:     Mental Status: She is alert and oriented to person, place, and time. Mental status is at baseline.  Psychiatric:  Mood and Affect: Mood normal.        Behavior: Behavior normal.        Thought Content: Thought content normal.        Judgment: Judgment normal.   Physical exam limited by virtual appointment  Results:  Studies Obtained And Personally Reviewed By Me: Labs:     Component Value Date/Time   NA 141 10/06/2023 1326   K 3.7 10/06/2023 1326   CL 106 10/06/2023 1326   CO2 28 10/06/2023 1326   GLUCOSE 96 10/06/2023 1326   BUN 14  10/06/2023 1326   CREATININE 0.85 10/06/2023 1326   CREATININE 0.75 02/10/2023 1120   CALCIUM 9.5 10/06/2023 1326   PROT 6.5 02/10/2023 1120   ALBUMIN 4.3 12/08/2015 1122   AST 22 02/10/2023 1120   ALT 18 02/10/2023 1120   ALKPHOS 75 12/08/2015 1122   BILITOT 0.7 02/10/2023 1120   GFRNONAA >60 10/06/2023 1326   GFRNONAA 92 01/29/2020 0950   GFRAA 106 01/29/2020 0950    Lab Results  Component Value Date   WBC 5.9 10/06/2023   HGB 15.2 (H) 10/06/2023   HCT 44.4 10/06/2023   MCV 91.0 10/06/2023   PLT 227 10/06/2023   Lab Results  Component Value Date   CHOL 207 (H) 01/20/2023   HDL 83 01/20/2023   LDLCALC 108 (H) 01/20/2023   TRIG 70 01/20/2023   CHOLHDL 2.5 01/20/2023    Lab Results  Component Value Date   TSH 1.01 01/20/2023    Assessment & Plan:   Acute Upper Respiratory Infection: Physical exam limited due to virtual appointment, however patient sounds congested and will follow-up in office if respiratory symptoms worsen per my instruction. Sending in Augmentin 875-125 mg - take 1 capsule by mouth 2 (two) times daily for 10 days, 100 mg Tessalon Perles -  take 1 capsule (100 mg total) by mouth 3 (three) times daily as needed for cough, Prednisone 10 mg tapering course (#21) tabs to take as directed 6-5-4-3-2-1, and  may take 150 mg Diflucan tab x 1 if she develops Candida vaginitis while on antibiotics.  Rest and stay well hydrated. Monitor temp and other symptoms. Call if not improving in 2-3 days or sooner if worse.    I,[name],acting as a Neurosurgeon for Margaree Mackintosh, MD.,have documented all relevant documentation on the behalf of Margaree Mackintosh, MD,as directed by  Margaree Mackintosh, MD while in the presence of Margaree Mackintosh, MD.   I, Margaree Mackintosh, MD, have reviewed all documentation for this visit. The documentation on 11/21/23 for the exam, diagnosis, procedures, and orders are all accurate and complete.

## 2023-11-29 ENCOUNTER — Encounter: Payer: Self-pay | Admitting: Internal Medicine

## 2023-12-22 ENCOUNTER — Encounter: Payer: Self-pay | Admitting: Internal Medicine

## 2024-01-04 NOTE — Progress Notes (Incomplete)
 Annual Wellness Visit   Patient Care Team: Baxley, Jaynie Meyers, MD as PCP - General (Internal Medicine) Cameron Cea, MD as Consulting Physician (Hematology and Oncology) Enid Harry, MD as Consulting Physician (General Surgery) Colie Dawes, MD as Attending Physician (Radiation Oncology) Percival Brace, NP as Nurse Practitioner (Hematology and Oncology) Alger Infield, MD as Consulting Physician (Plastic Surgery)  Visit Date: 01/04/24   No chief complaint on file.  Subjective:  Patient: [name], Female DOB: [birth year], 58 y.o. MRN: [id number] There were no vitals filed for this visit. [name] is a 58 y.o. Female who presents today for her Annual Wellness Visit. Patient has Malignant neoplasm of upper-outer quadrant of left breast in female, estrogen receptor positive (HCC); History of ductal carcinoma in situ (DCIS) of breast; and History of therapeutic radiation on their problem list.  History of Hypertension treated with ***. {Blood Pressure:32095::"normotensive"} today at ***/***.   History of Hyperlipidemia treated with  *** . ***/***/*** Lipid Panel *** Lab Results  Component Value Date   CHOL 207 (H) 01/20/2023   CHOL 211 (H) 10/15/2021   HDL 83 01/20/2023   HDL 101 10/15/2021   LDLCALC 108 (H) 01/20/2023   LDLCALC 95 10/15/2021   TRIG 70 01/20/2023   TRIG 63 10/15/2021    History of {IGTvsDM:32677} treated with ***.  ***/***/*** HgbA1c ***, Glucose ***.  Lab Results  Component Value Date   GLUCOSE 96 10/06/2023   GLUCOSE 86 02/10/2023     History of *** treated with  History of *** treated with  History of *** treated with  History of *** treated with  History of *** treated with   Current Outpatient Medications (Endocrine & Metabolic):    predniSONE  (DELTASONE ) 10 MG tablet, Take in tapering course  by mouth as directed 6-5-4-3-2-1 taper for sinus congestion   Current Outpatient Medications (Respiratory):     benzonatate  (TESSALON ) 100 MG capsule, Take 1 capsule (100 mg total) by mouth 3 (three) times daily as needed for cough.  Current Outpatient Medications (Analgesics):    Meloxicam  5 MG CAPS, Take 5 mg by mouth daily as needed.   Current Outpatient Medications (Other):    ALPRAZolam  (XANAX ) 0.25 MG tablet, Take 1 tablet (0.25 mg total) by mouth 3 (three) times daily as needed for anxiety.   amoxicillin -clavulanate (AUGMENTIN ) 875-125 MG tablet, Take 1 tablet by mouth 2 (two) times daily.   valACYclovir  (VALTREX ) 500 MG tablet, Take 1 tablet (500 mg total) by mouth 2 (two) times daily. (Patient taking differently: Take 500 mg by mouth 2 (two) times daily as needed.)  Labs ***/***/*** CBC: *** Lab Results  Component Value Date   WBC 5.9 10/06/2023   RBC 4.88 10/06/2023   HGB 15.2 (H) 10/06/2023   HCT 44.4 10/06/2023   PLT 227 10/06/2023   MCV 91.0 10/06/2023   MCH 31.1 10/06/2023   MCHC 34.2 10/06/2023   RDW 11.9 10/06/2023   LYMPHSABS 1,428 01/20/2023   MONOABS 470 12/08/2015   BASOSABS 39 01/20/2023   CMP: ***  Lab Results  Component Value Date   NA 141 10/06/2023   K 3.7 10/06/2023   CL 106 10/06/2023   CO2 28 10/06/2023   GLUCOSE 96 10/06/2023   BUN 14 10/06/2023   CREATININE 0.85 10/06/2023   CALCIUM 9.5 10/06/2023   PROT 6.5 02/10/2023   ALBUMIN 4.3 12/08/2015   AST 22 02/10/2023   ALT 18 02/10/2023   ALKPHOS 75 12/08/2015   BILITOT 0.7 02/10/2023  EGFR 79 01/20/2023   GFRNONAA >60 10/06/2023   Lipid Panel: *** Lab Results  Component Value Date   CHOL 207 (H) 01/20/2023   HDL 83 01/20/2023   LDLCALC 108 (H) 01/20/2023   TRIG 70 01/20/2023   HgbA1c: *** No results found for: "HGBA1C" TSH: *** Lab Results  Component Value Date   TSH 1.01 01/20/2023    {Man or Woman:32389}  Vaccine Counseling: Due for {Vaccines:32291::"Flu"}; UTD on {Vaccines:32291::"Flu"} Health Maintenance Due  Topic Date Due   HIV Screening  Never done   Hepatitis C Screening   Never done   Past Medical History:  Diagnosis Date   Anxiety    Breast cancer (HCC)    Breast mass 03/26/2017   3 oclock x 1 week    Cancer (HCC) 04/2017   left breast cancer   History of radiation therapy 07/18/17- 09/02/17   Left Breast 50.4 Gy in 28 fractions, Left Breast Boost 10 Gy in 5 fractions.    Personal history of radiation therapy    Medical/Surgical History Narrative:   has a past medical history of Anxiety, Breast cancer (HCC), Breast mass (03/26/2017), Cancer (HCC) (04/2017), History of radiation therapy (07/18/17- 09/02/17), and Personal history of radiation therapy.  Allergic/Intolerant to:  No Known Allergies *** - ***  *** - ***  *** - ***  *** - ***  *** - ***  *** - ***  *** - ***  *** - *** Past Surgical History:  Procedure Laterality Date   AUGMENTATION MAMMAPLASTY Bilateral 2012   Saline    BREAST IMPLANT EXCHANGE Bilateral 06/03/2017   Procedure: REMOVAL BREAST IMPLANTS WITH REPLACEMENT RIGHT, LEFT BREAST IMPLANT PLACEMEMT;  Surgeon: Alger Infield, MD;  Location: [facility];  Service: Plastics;  Laterality: Bilateral;   BREAST IMPLANT REMOVAL Left 05/26/2017   Procedure: REMOVAL BREAST IMPLANT;  Surgeon: Enid Harry, MD;  Location: [facility];  Service: General;  Laterality: Left;   BREAST LUMPECTOMY Left    05/2017   BREAST LUMPECTOMY WITH RADIOACTIVE SEED AND SENTINEL LYMPH NODE BIOPSY Left 05/26/2017   Procedure: LEFT BREAST LUMPECTOMY WITH BRACKETED RADIOACTIVE SEED AND SENTINEL LYMPH NODE BIOPSY;  Surgeon: Enid Harry, MD;  Location: [facility];  Service: General;  Laterality: Left;   BREAST SURGERY     implant Dec 2012   Other - Hx of: *** ; Surghx of: *** Family History  Problem Relation Age of Onset   Myelodysplastic syndrome Father    Heart disease Maternal Grandmother    Healthy Mother    Breast cancer Neg Hx    Family History Narrative: family history includes Healthy  in her mother; Heart disease in her maternal grandmother; Myelodysplastic syndrome in her father. There is no history of Breast cancer. Social History   Social History Narrative   Not on file    ROS  Objective:  Vitals: LMP 03/04/2018  Physical Exam Most Recent Functional Status Assessment:     No data to display         Most Recent Fall Risk Assessment:    01/21/2023    3:05 PM  Fall Risk   Falls in the past year? 0  Number falls in past yr: 0  Injury with Fall? 0  Follow up Falls evaluation completed   Most Recent Depression Screenings:    01/21/2023    3:05 PM 10/16/2021    2:41 PM  PHQ 2/9 Scores  PHQ - 2 Score 0 0   Most Recent Cognitive  Screening:     No data to display         Results:  Studies Obtained And Personally Reviewed By Me:  {Imaging, colonoscopy, mammogram, bone density scan, echocardiogram, heart cath, stress test, CT calcium score, etc.:32292}  Labs:     Component Value Date/Time   NA 141 10/06/2023 1326   K 3.7 10/06/2023 1326   CL 106 10/06/2023 1326   CO2 28 10/06/2023 1326   GLUCOSE 96 10/06/2023 1326   BUN 14 10/06/2023 1326   CREATININE 0.85 10/06/2023 1326   CREATININE 0.75 02/10/2023 1120   CALCIUM 9.5 10/06/2023 1326   PROT 6.5 02/10/2023 1120   ALBUMIN 4.3 12/08/2015 1122   AST 22 02/10/2023 1120   ALT 18 02/10/2023 1120   ALKPHOS 75 12/08/2015 1122   BILITOT 0.7 02/10/2023 1120   GFRNONAA >60 10/06/2023 1326   GFRNONAA 92 01/29/2020 0950   GFRAA 106 01/29/2020 0950    Lab Results  Component Value Date   WBC 5.9 10/06/2023   HGB 15.2 (H) 10/06/2023   HCT 44.4 10/06/2023   MCV 91.0 10/06/2023   PLT 227 10/06/2023   Lab Results  Component Value Date   CHOL 207 (H) 01/20/2023   HDL 83 01/20/2023   LDLCALC 108 (H) 01/20/2023   TRIG 70 01/20/2023   CHOLHDL 2.5 01/20/2023   No results found for: "HGBA1C"  Lab Results  Component Value Date   TSH 1.01 01/20/2023    {PSA (Optional):32132} Assessment & Plan:   No orders of the defined types were placed in this encounter.  No orders of the defined types were placed in this encounter.  Other Labs Reviewed today:       Annual wellness visit done today including the all of the following: Reviewed patient's Family Medical History Reviewed and updated list of patient's medical providers Assessment of cognitive impairment was done Assessed patient's functional ability Established a written schedule for health screening services Health Risk Assessent Completed and Reviewed  Discussed health benefits of physical activity, and encouraged her to engage in regular exercise appropriate for her age and condition.    I,Emily Lagle,acting as a Neurosurgeon for Sylvan Evener, MD.,have documented all relevant documentation on the behalf of Sylvan Evener, MD,as directed by  Sylvan Evener, MD while in the presence of Sylvan Evener, MD.   ***

## 2024-01-18 ENCOUNTER — Ambulatory Visit: Admitting: Internal Medicine

## 2024-01-18 ENCOUNTER — Encounter: Payer: Self-pay | Admitting: Internal Medicine

## 2024-01-18 ENCOUNTER — Other Ambulatory Visit

## 2024-01-18 VITALS — BP 110/80 | HR 72 | Ht 65.0 in | Wt 142.0 lb

## 2024-01-18 DIAGNOSIS — Z01818 Encounter for other preprocedural examination: Secondary | ICD-10-CM

## 2024-01-18 DIAGNOSIS — E785 Hyperlipidemia, unspecified: Secondary | ICD-10-CM | POA: Diagnosis not present

## 2024-01-18 DIAGNOSIS — Z0000 Encounter for general adult medical examination without abnormal findings: Secondary | ICD-10-CM | POA: Diagnosis not present

## 2024-01-18 DIAGNOSIS — Z853 Personal history of malignant neoplasm of breast: Secondary | ICD-10-CM

## 2024-01-19 ENCOUNTER — Encounter: Payer: Self-pay | Admitting: Internal Medicine

## 2024-01-19 ENCOUNTER — Other Ambulatory Visit: Payer: BC Managed Care – PPO

## 2024-01-19 ENCOUNTER — Ambulatory Visit

## 2024-01-19 ENCOUNTER — Ambulatory Visit: Payer: Self-pay | Admitting: Internal Medicine

## 2024-01-19 LAB — CBC WITH DIFFERENTIAL/PLATELET
Absolute Lymphocytes: 1828 {cells}/uL (ref 850–3900)
Absolute Monocytes: 357 {cells}/uL (ref 200–950)
Basophils Absolute: 30 {cells}/uL (ref 0–200)
Basophils Relative: 0.7 %
Eosinophils Absolute: 77 {cells}/uL (ref 15–500)
Eosinophils Relative: 1.8 %
HCT: 44.1 % (ref 35.0–45.0)
Hemoglobin: 14.5 g/dL (ref 11.7–15.5)
MCH: 30.9 pg (ref 27.0–33.0)
MCHC: 32.9 g/dL (ref 32.0–36.0)
MCV: 93.8 fL (ref 80.0–100.0)
MPV: 10 fL (ref 7.5–12.5)
Monocytes Relative: 8.3 %
Neutro Abs: 2008 {cells}/uL (ref 1500–7800)
Neutrophils Relative %: 46.7 %
Platelets: 222 10*3/uL (ref 140–400)
RBC: 4.7 10*6/uL (ref 3.80–5.10)
RDW: 12 % (ref 11.0–15.0)
Total Lymphocyte: 42.5 %
WBC: 4.3 10*3/uL (ref 3.8–10.8)

## 2024-01-19 LAB — PROTIME-INR
INR: 1
Prothrombin Time: 10.7 s (ref 9.0–11.5)

## 2024-01-19 LAB — COMPLETE METABOLIC PANEL WITHOUT GFR
AG Ratio: 1.8 (calc) (ref 1.0–2.5)
ALT: 17 U/L (ref 6–29)
AST: 19 U/L (ref 10–35)
Albumin: 4.4 g/dL (ref 3.6–5.1)
Alkaline phosphatase (APISO): 75 U/L (ref 37–153)
BUN: 11 mg/dL (ref 7–25)
CO2: 23 mmol/L (ref 20–32)
Calcium: 9.6 mg/dL (ref 8.6–10.4)
Chloride: 106 mmol/L (ref 98–110)
Creat: 0.74 mg/dL (ref 0.50–1.03)
Globulin: 2.4 g/dL (ref 1.9–3.7)
Glucose, Bld: 79 mg/dL (ref 65–99)
Potassium: 4.2 mmol/L (ref 3.5–5.3)
Sodium: 138 mmol/L (ref 135–146)
Total Bilirubin: 0.8 mg/dL (ref 0.2–1.2)
Total Protein: 6.8 g/dL (ref 6.1–8.1)

## 2024-01-19 LAB — LIPID PANEL
Cholesterol: 201 mg/dL — ABNORMAL HIGH (ref <200)
HDL: 86 mg/dL (ref >=50)
LDL Cholesterol (Calc): 101 mg/dL — ABNORMAL HIGH
Non-HDL Cholesterol (Calc): 115 mg/dL (ref <130)
Total CHOL/HDL Ratio: 2.3 (calc) (ref <5.0)
Triglycerides: 58 mg/dL (ref <150)

## 2024-01-19 LAB — POCT URINALYSIS DIP (CLINITEK)
Bilirubin, UA: NEGATIVE
Blood, UA: NEGATIVE
Glucose, UA: NEGATIVE mg/dL
Ketones, POC UA: NEGATIVE mg/dL
Leukocytes, UA: NEGATIVE
Nitrite, UA: NEGATIVE
POC PROTEIN,UA: NEGATIVE
Spec Grav, UA: 1.015 (ref 1.010–1.025)
Urobilinogen, UA: 0.2 U/dL
pH, UA: 6 (ref 5.0–8.0)

## 2024-01-19 LAB — HEMOGLOBIN A1C
Hgb A1c MFr Bld: 5.1 % (ref <5.7)
Mean Plasma Glucose: 100 mg/dL
eAG (mmol/L): 5.5 mmol/L

## 2024-01-19 LAB — APTT: aPTT: 31 s (ref 23–32)

## 2024-01-19 NOTE — Progress Notes (Deleted)
   Subjective:    Patient ID: [name], female    DOB: 1966/01/14, 58 y.o.   MRN: [id number]  HPI    Review of Systems     Objective:   Physical Exam        Assessment & Plan:

## 2024-01-19 NOTE — Patient Instructions (Addendum)
 Please see Dr. Lavonne Prairie for Cardiovascular risk assessment in August. Labs are stable. Medically cleared to have breast implant surgery in the near future. Return in one year or as needed. It was a pleasure to see you today.

## 2024-01-19 NOTE — Progress Notes (Signed)
 Subjective:    Patient ID: [name], female    DOB: 30-Mar-1966, 58 y.o.   MRN: [id number]  HPI 58 year old Female seen for medical clearance for reconstructive left breast implant surgery by Dr. Valeta Gaudier. This is scheduled for June 25th. Also here for annual health maintenance exam. Doing well with no complaints. Exercises and stays in great physical shape.  Past medical history: She had COVID-19 in August 2024.  She has history of left breast cancer status postlumpectomy for high-grade DCIS in 2018 spanning 3.3 cm.  Margins were negative.  0 out of 1 lymph node negative.  She received radiation therapy.  0 out of 1 lymph node negative.  ER 30% and PR 0%.  Patient was offered Tamoxifen  but did not want to take it.  Received radiation therapy.  She sees Dr. Arlee Lace with [facility] for GYN care annually and was last seen there February 2025.  She initially had breast implants placed in 2012.  She took oral contraceptives until 2017.  History of right ovarian cyst in 2002.  Also had multiple clear follicular cysts of both ovaries at that time.  In 2003, transvaginal ultrasound showed resolution of right ovarian cyst.  Pap was positive for HPV 16 in 2019 but HPV was not detected in 2020 Pap.  Family history: Father died at 62 due to myeloproliferative disorder.  2 sisters in excellent health.  Social history: Non-smoker.  Social alcohol consumption.  2 adult sons.  Works as a  Environmental consultant.   First husband died of sudden cardiac arrest in 2016. She has remarried and  is doing well.  Patient would like to be seen for Cardiology evaluation and risk stratification.  She  has upcoming appointment with Dr. Lavonne Prairie August 29.  Currently has no cardiac symptoms.  Coronary calcium score February 2025 was 106.  Has very mild pure hypercholesterolemia.  2 years ago, she was exercising a lot and HDL was 101.  HDL is always been high.  5 years ago it was 91 and 3 years ago it was 80.  5  years ago, her LDL was 87.  Has not wanted to be on statin medication but is eager to see cardiologist to be proactive in managing her health.  Review of Systems no complaint of chest pain, shortness of breath, abdominal pain, significant joint pain     Objective:   Physical Exam Seen today in no acute distress.  She looks great.  Blood pressure 110/80 pulse 72 regular.  Weight 142 pounds.  Height 5 feet 5 inches.  BMI 23.63.  Pulse oximetry 98%. Skin: Warm and dry.  No cervical adenopathy, thyromegaly or carotid bruits.  Chest is clear to auscultation.  Cardiac exam: Regular rate and rhythm without murmur or ectopy.  Left breast implant is deflated.  Right breast has implant without masses.  Abdomen soft nondistended without hepatosplenomegaly masses or tenderness.  Pelvic exam is deferred to GYN physician.  No lower extremity pitting edema.  Neurological exam is intact without gross focal deficits.  Affect, thought and judgment are normal.      Assessment & Plan:   High-grade DCIS diagnosed in 2018 status post lumpectomy.  Doing well.  Was offered tamoxifen  but declined.  History of right ovarian cyst 2002.  History of breast implants-left implant is deflated and she is to have surgery in the near future  by plastic surgeon for this.  Coronary calcium score 106-patient would like Cardiology evaluation and this is  scheduled with Dr. Lavonne Prairie in August. She would like to be proactive managing her cardiac health. First husband died of a sudden cardiac arrest.

## 2024-01-23 ENCOUNTER — Encounter: Payer: BC Managed Care – PPO | Admitting: Internal Medicine

## 2024-02-08 DIAGNOSIS — N651 Disproportion of reconstructed breast: Secondary | ICD-10-CM | POA: Diagnosis not present

## 2024-02-08 DIAGNOSIS — T8549XA Other mechanical complication of breast prosthesis and implant, initial encounter: Secondary | ICD-10-CM | POA: Diagnosis not present

## 2024-02-08 DIAGNOSIS — Z853 Personal history of malignant neoplasm of breast: Secondary | ICD-10-CM | POA: Diagnosis not present

## 2024-02-08 DIAGNOSIS — T8541XA Breakdown (mechanical) of breast prosthesis and implant, initial encounter: Secondary | ICD-10-CM | POA: Diagnosis not present

## 2024-02-08 DIAGNOSIS — N650 Deformity of reconstructed breast: Secondary | ICD-10-CM | POA: Diagnosis not present

## 2024-02-08 DIAGNOSIS — Z411 Encounter for cosmetic surgery: Secondary | ICD-10-CM | POA: Diagnosis not present

## 2024-02-14 ENCOUNTER — Encounter: Admitting: Internal Medicine

## 2024-04-03 DIAGNOSIS — M25511 Pain in right shoulder: Secondary | ICD-10-CM | POA: Diagnosis not present

## 2024-04-11 DIAGNOSIS — R931 Abnormal findings on diagnostic imaging of heart and coronary circulation: Secondary | ICD-10-CM | POA: Insufficient documentation

## 2024-04-11 NOTE — Progress Notes (Unsigned)
 Cardiology Office Note   Date:  04/13/2024   ID:  Yarima, Penman 1965-11-15, MRN [id number]  PCP:  Perri Ronal JINNY, MD  Cardiologist:   None Referring:  Perri Ronal JINNY, MD  Chief Complaint  Patient presents with   Elevated coronary calcium       History of Present Illness: [name] is a 58 y.o. female who was seen by Dr. Raford in 2017.  She was referred by Perri Ronal JINNY, MD.  At that time she has no history of CAD.  She had a coronary calcium  score recently that was elevated.   The score was 106 and 92nd percentile.   She has had no past history.  Her first husband died at 66 of a myocardial infarction.  She has 2 sons who are being managed for lipid problems.  She herself has never had any issue.  She has been physically active.  She does Pilates.  She does some walking on treadmill elliptical.  She just had breast reconstruction surgery. The patient denies any new symptoms such as chest discomfort, neck or arm discomfort. There has been no new shortness of breath, PND or orthopnea. There have been no reported palpitations, presyncope or syncope.     Past Medical History:  Diagnosis Date   Anxiety    Breast cancer (HCC)    Breast mass 03/26/2017   3 oclock x 1 week    Cancer (HCC) 04/2017   left breast cancer   History of radiation therapy 07/18/17- 09/02/17   Left Breast 50.4 Gy in 28 fractions, Left Breast Boost 10 Gy in 5 fractions.    Personal history of radiation therapy     Past Surgical History:  Procedure Laterality Date   AUGMENTATION MAMMAPLASTY Bilateral 2012   Saline    BREAST IMPLANT EXCHANGE Bilateral 06/03/2017   Procedure: REMOVAL BREAST IMPLANTS WITH REPLACEMENT RIGHT, LEFT BREAST IMPLANT PLACEMEMT;  Surgeon: Arelia Filippo, MD;  Location: [facility];  Service: Plastics;  Laterality: Bilateral;   BREAST IMPLANT REMOVAL Left 05/26/2017   Procedure: REMOVAL BREAST IMPLANT;  Surgeon: Ebbie Cough, MD;  Location: [facility];  Service: General;  Laterality: Left;   BREAST LUMPECTOMY Left    05/2017   BREAST LUMPECTOMY WITH RADIOACTIVE SEED AND SENTINEL LYMPH NODE BIOPSY Left 05/26/2017   Procedure: LEFT BREAST LUMPECTOMY WITH BRACKETED RADIOACTIVE SEED AND SENTINEL LYMPH NODE BIOPSY;  Surgeon: Ebbie Cough, MD;  Location: [facility];  Service: General;  Laterality: Left;   BREAST SURGERY     implant Dec 2012     Current Outpatient Medications  Medication Sig Dispense Refill   atorvastatin  (LIPITOR) 20 MG tablet Take 1 tablet (20 mg total) by mouth daily. 90 tablet 3   benzonatate  (TESSALON ) 100 MG capsule Take 1 capsule (100 mg total) by mouth 3 (three) times daily as needed for cough. 30 capsule 0   Meloxicam  5 MG CAPS Take 5 mg by mouth daily as needed. 30 capsule    No current facility-administered medications for this visit.    Allergies:   Patient has no known allergies.    Social History:  The patient  reports that she has never smoked. She has never used smokeless tobacco. She reports current alcohol use of about 7.0 standard drinks of alcohol per week. She reports that she does not use drugs.   Family History:  The patient's family history includes Healthy in her mother; Heart disease in her maternal  grandmother; Myelodysplastic syndrome in her father.    ROS:  Please see the history of present illness.   Otherwise, review of systems are positive for none.   All other systems are reviewed and negative.    PHYSICAL EXAM: VS:  BP 120/82   Pulse 73   Ht 5' 5 (1.651 m)   Wt 142 lb (64.4 kg)   LMP 03/04/2018   SpO2 98%   BMI 23.63 kg/m  , BMI Body mass index is 23.63 kg/m. GENERAL:  Well appearing HEENT:  Pupils equal round and reactive, fundi not visualized, oral mucosa unremarkable NECK:  No jugular venous distention, waveform within normal limits, carotid upstroke brisk and symmetric, no bruits, no thyromegaly LYMPHATICS:  No cervical, inguinal  adenopathy LUNGS:  Clear to auscultation bilaterally BACK:  No CVA tenderness CHEST:  Unremarkable HEART:  PMI not displaced or sustained,S1 and S2 within normal limits, no S3, no S4, no clicks, no rubs, no murmurs ABD:  Flat, positive bowel sounds normal in frequency in pitch, no bruits, no rebound, no guarding, no midline pulsatile mass, no hepatomegaly, no splenomegaly EXT:  2 plus pulses throughout, no edema, no cyanosis no clubbing SKIN:  No rashes no nodules NEURO:  Cranial nerves II through XII grossly intact, motor grossly intact throughout PSYCH:  Cognitively intact, oriented to person place and time    EKG:    Sinus rhythm, rate 90, axis within normal limits, intervals within normal limits, poor anterior R wave progression, no acute ST-T wave changes.  10/06/2023.  Nonspecific ST-T wave changes were new since 2017.  Poor anterior R wave progression was seen at that time as well.    Recent Labs: 01/18/2024: ALT 17; BUN 11; Creat 0.74; Hemoglobin 14.5; Platelets 222; Potassium 4.2; Sodium 138    Lipid Panel    Component Value Date/Time   CHOL 201 (H) 01/18/2024 1144   TRIG 58 01/18/2024 1144   HDL 86 01/18/2024 1144   CHOLHDL 2.3 01/18/2024 1144   VLDL 24 12/08/2015 1122   LDLCALC 101 (H) 01/18/2024 1144      Wt Readings from Last 3 Encounters:  04/13/24 142 lb (64.4 kg)  01/18/24 142 lb (64.4 kg)  10/07/23 143 lb (64.9 kg)      Other studies Reviewed: Additional studies/ records that were reviewed today include: Labs. Review of the above records demonstrates:  Please see elsewhere in the note.     ASSESSMENT AND PLAN:  Elevated coronary calcium : I would like to bring her back for a POET (Plain Old Exercise Treadmill).  I am also going to aggressively manage her with primary risk reduction.  Toward that end and going to manage her lipids as below.  Dyslipidemia: LDL is mildly elevated 101 although her HDL is 86.  I would like her LDL to be in the 50s given this  plaque and I will start her on Crestor 20 mg daily.  I will check a lipid profile and LP(a) in 3 months.   Current medicines are reviewed at length with the patient today.  The patient does not have concerns regarding medicines.  The following changes have been made:  no change  Labs/ tests ordered today include:   Orders Placed This Encounter  Procedures   Lipid panel   Lipoprotein A (LPA)   EXERCISE TOLERANCE TEST (ETT)     Disposition:   FU with me in a couple of years.     Signed, Lynwood Schilling, MD  04/13/2024 9:57 AM  [facility]

## 2024-04-12 DIAGNOSIS — M25511 Pain in right shoulder: Secondary | ICD-10-CM | POA: Diagnosis not present

## 2024-04-13 ENCOUNTER — Ambulatory Visit: Attending: Cardiology | Admitting: Cardiology

## 2024-04-13 ENCOUNTER — Other Ambulatory Visit: Payer: Self-pay

## 2024-04-13 ENCOUNTER — Other Ambulatory Visit: Payer: Self-pay | Admitting: Internal Medicine

## 2024-04-13 ENCOUNTER — Encounter: Payer: Self-pay | Admitting: Cardiology

## 2024-04-13 VITALS — BP 120/82 | HR 73 | Ht 65.0 in | Wt 142.0 lb

## 2024-04-13 DIAGNOSIS — R931 Abnormal findings on diagnostic imaging of heart and coronary circulation: Secondary | ICD-10-CM

## 2024-04-13 DIAGNOSIS — I2510 Atherosclerotic heart disease of native coronary artery without angina pectoris: Secondary | ICD-10-CM

## 2024-04-13 MED ORDER — ATORVASTATIN CALCIUM 20 MG PO TABS: 20.0000 mg | ORAL_TABLET | Freq: Every day | ORAL | 3 refills | Status: AC

## 2024-04-13 NOTE — Patient Instructions (Addendum)
 Medication Instructions:  Start Lipitor 20 mg once daily *If you need a refill on your cardiac medications before your next appointment, please call your pharmacy*  Lab Work: Lp(a),fasting lipid panel in 3 months If you have labs (blood work) drawn today and your tests are completely normal, you will receive your results only by: MyChart Message (if you have MyChart) OR A paper copy in the mail If you have any lab test that is abnormal or we need to change your treatment, we will call you to review the results.  Testing/Procedures: Exercise Tolerance Test **No food or drink for 3-4 hours prior to test **No caffeine, decaf or chocolate for 12 hrs prior to test **Wear comfortable clothing and close toed shoes  Follow-Up: At [facility], you and your health needs are our priority.  As part of our continuing mission to provide you with exceptional heart care, our providers are all part of one team.  This team includes your primary Cardiologist (physician) and Advanced Practice Providers or APPs (Physician Assistants and Nurse Practitioners) who all work together to provide you with the care you need, when you need it.  Your next appointment:   2 years  Provider:   Lavona, MD  We recommend signing up for the patient portal called MyChart.  Sign up information is provided on this After Visit Summary.  MyChart is used to connect with patients for Virtual Visits (Telemedicine).  Patients are able to view lab/test results, encounter notes, upcoming appointments, etc.  Non-urgent messages can be sent to your provider as well.   To learn more about what you can do with MyChart, go to [email or web address].

## 2024-04-13 NOTE — Addendum Note (Signed)
 Addended by: Tyra Michelle N on: 04/13/2024 10:07 AM   Modules accepted: Orders

## 2024-04-27 ENCOUNTER — Telehealth: Payer: Self-pay | Admitting: Internal Medicine

## 2024-04-27 DIAGNOSIS — Z87440 Personal history of urinary (tract) infections: Secondary | ICD-10-CM

## 2024-04-27 MED ORDER — NITROFURANTOIN MONOHYD MACRO 100 MG PO CAPS: ORAL_CAPSULE | ORAL | 0 refills | Status: AC

## 2024-04-27 NOTE — Telephone Encounter (Signed)
 Called patient to review medication request for macrobid  ; a discontinued medication. No answer, LVMTCB [phone]. Please advise and call patient back.

## 2024-04-27 NOTE — Telephone Encounter (Signed)
 Sending in Macrobid  for post coital prophylaxis

## 2024-04-27 NOTE — Telephone Encounter (Unsigned)
 Copied from CRM #[id number]. Topic: Clinical - Medication Refill >> Apr 27, 2024 12:54 PM Winona R wrote: Medication: nitrofurantoin , macrocrystal-monohydrate, (MACROBID ) 100 MG capsule [[id number]] DISCONTINUED- Pt states she uses them after intercourse and has always taken them when needed.   Has the patient contacted their pharmacy? Yes it was denied through pharmacy  (Agent: If no, request that the patient contact the pharmacy for the refill. If patient does not wish to contact the pharmacy document the reason why and proceed with request.) (Agent: If yes, when and what did the pharmacy advise?)  This is the patient's preferred pharmacy:  [id number] - [place], [place] - [address] AT [address] OF [facility] [address] [place] [address] Phone: [phone] Fax: [phone]  Is this the correct pharmacy for this prescription? Yes If no, delete pharmacy and type the correct one.   Has the prescription been filled recently? No  Is the patient out of the medication? Yes  Has the patient been seen for an appointment in the last year OR does the patient have an upcoming appointment? Yes  Can we respond through MyChart? Yes  Agent: Please be advised that Rx refills may take up to 3 business days. We ask that you follow-up with your pharmacy.

## 2024-05-04 DIAGNOSIS — M25511 Pain in right shoulder: Secondary | ICD-10-CM | POA: Diagnosis not present

## 2024-05-08 ENCOUNTER — Telehealth: Payer: Self-pay

## 2024-05-08 NOTE — Telephone Encounter (Signed)
   Pre-operative Risk Assessment    Patient Name: [name]  DOB: 1965-12-21 MRN: [id number]   Date of last office visit: 04/13/24 Date of next office visit: 05/18/24   Request for Surgical Clearance    Procedure:  Right shoulder scope, manipulation lysis of adhesions  Date of Surgery:  Clearance 08/13/24                                Surgeon:  Bonner Hair ,MD Surgeon's Group or Practice Name:  [facility] Phone number:  [phone] Fax number:  [phone]   Type of Clearance Requested:   - Medical    Type of Anesthesia:  Not Indicated   Additional requests/questions:    Bonney Ival LOISE Gerome   05/08/2024, 10:36 AM

## 2024-05-08 NOTE — Telephone Encounter (Signed)
 She is scheduled for an ETT on 05/18/2024. Will wait for this before completing risk assessment.   Ethen Bannan E Alonzo Owczarzak, PA-C 05/08/2024 10:42 AM

## 2024-05-11 ENCOUNTER — Encounter (HOSPITAL_COMMUNITY): Payer: Self-pay | Admitting: *Deleted

## 2024-05-18 ENCOUNTER — Ambulatory Visit (HOSPITAL_COMMUNITY)
Admission: RE | Admit: 2024-05-18 | Discharge: 2024-05-18 | Disposition: A | Discharge status: Home / Self Care | Source: Ambulatory Visit | Attending: Cardiology | Admitting: Cardiology

## 2024-05-18 ENCOUNTER — Ambulatory Visit: Payer: Self-pay | Admitting: Cardiology

## 2024-05-18 DIAGNOSIS — R931 Abnormal findings on diagnostic imaging of heart and coronary circulation: Secondary | ICD-10-CM | POA: Insufficient documentation

## 2024-05-18 LAB — EXERCISE TOLERANCE TEST
Angina Index: 0
Base ST Depression (mm): 0 mm
Duke Treadmill Score: 7
Estimated workload: 13.4
Exercise duration (min): 12 min
Exercise duration (sec): 0 s
MPHR: 161 {beats}/min
Peak HR: 171 {beats}/min
Percent HR: 106 %
RPE: 17
Rest HR: 89 {beats}/min
ST Depression (mm): 1 mm

## 2024-05-24 ENCOUNTER — Other Ambulatory Visit: Payer: Self-pay | Admitting: Obstetrics and Gynecology

## 2024-05-24 DIAGNOSIS — Z1231 Encounter for screening mammogram for malignant neoplasm of breast: Secondary | ICD-10-CM

## 2024-05-25 NOTE — Telephone Encounter (Signed)
 Dr.Hochrein,  You saw this patient on 04/13/2024 and ordered POET. This resulted on 05/18/2024. Per protocol we request that you comment on his cardiac risk to proceed with  Right shoulder scope, manipulation lysis of adhesions scheduled for 08/13/2024, since it has been less than 2 months since evaluated in the office. Please send your comment to P CV Pre-Op Pool.  Thank you, Sandra Satterfield DNP, ANP, AACC.

## 2024-05-25 NOTE — Telephone Encounter (Signed)
   Patient Name: [name]  DOB: May 19, 1966 MRN: [id number]  Primary Cardiologist: Lavona  Chart reviewed as part of pre-operative protocol coverage. Given past medical history and time since last visit, based on ACC/AHA guidelines, [name] is at acceptable risk for the planned procedure without further cardiovascular testing.   The patient was advised that if she develops new symptoms prior to surgery to contact our office to arrange for a follow-up visit, and she verbalized understanding.  I will route this recommendation to the requesting party via Epic fax function and remove from pre-op pool.  Please call with questions.  Lamarr Satterfield, NP 05/25/2024, 4:25 PM

## 2024-07-27 ENCOUNTER — Ambulatory Visit

## 2024-07-31 ENCOUNTER — Ambulatory Visit

## 2024-08-01 NOTE — Telephone Encounter (Signed)
 Done

## 2024-08-02 ENCOUNTER — Inpatient Hospital Stay
Admission: RE | Admit: 2024-08-02 | Discharge: 2024-08-02 | Attending: Obstetrics and Gynecology | Admitting: Obstetrics and Gynecology

## 2024-08-02 DIAGNOSIS — M19011 Primary osteoarthritis, right shoulder: Secondary | ICD-10-CM | POA: Diagnosis not present

## 2024-08-02 DIAGNOSIS — Z1231 Encounter for screening mammogram for malignant neoplasm of breast: Secondary | ICD-10-CM | POA: Diagnosis not present

## 2024-08-02 DIAGNOSIS — S43431D Superior glenoid labrum lesion of right shoulder, subsequent encounter: Secondary | ICD-10-CM | POA: Diagnosis not present

## 2024-08-02 DIAGNOSIS — M25811 Other specified joint disorders, right shoulder: Secondary | ICD-10-CM | POA: Diagnosis not present

## 2024-08-07 ENCOUNTER — Encounter: Payer: Self-pay | Admitting: Internal Medicine

## 2024-08-07 ENCOUNTER — Ambulatory Visit: Admitting: Internal Medicine

## 2024-08-07 VITALS — BP 110/80 | HR 88 | Ht 65.0 in | Wt 142.0 lb

## 2024-08-07 DIAGNOSIS — J22 Unspecified acute lower respiratory infection: Secondary | ICD-10-CM

## 2024-08-07 DIAGNOSIS — R059 Cough, unspecified: Secondary | ICD-10-CM

## 2024-08-07 LAB — POC COVID19/FLU A&B COMBO
Covid Antigen, POC: NEGATIVE
Influenza A Antigen, POC: NEGATIVE
Influenza B Antigen, POC: NEGATIVE

## 2024-08-07 MED ORDER — HYDROCODONE BIT-HOMATROP MBR 5-1.5 MG/5ML PO SOLN: 5.0000 mL | Freq: Three times a day (TID) | ORAL | 0 refills | Status: AC | PRN

## 2024-08-07 MED ORDER — AZITHROMYCIN 250 MG PO TABS: ORAL_TABLET | ORAL | 0 refills | Status: AC

## 2024-08-07 NOTE — Progress Notes (Signed)
 "   Patient Care Team: Miyah Hampshire, Ronal PARAS, MD as PCP - General (Internal Medicine) Odean Potts, MD as Consulting Physician (Hematology and Oncology) Ebbie Cough, MD as Consulting Physician (General Surgery) Izell Domino, MD as Attending Physician (Radiation Oncology) Crawford Morna Pickle, NP as Nurse Practitioner (Hematology and Oncology) Arelia Filippo, MD as Consulting Physician (Plastic Surgery)  Visit Date: 08/07/2024  Subjective:    Patient ID: [name] , Female   DOB: 12-Dec-1965, 58 y.o.    MRN: [id number]   58 y.o. Female presents today for cough. Patient has a past medical history of Malignant neoplasm of left breast.   She started to feel sick last Wednesday after her husband came back from [place] . She has also been traveling recently. She has a productive cough and congestion. She said that she felt better on Saturday but then her symptoms worsened. She took an at home Covid-19 test and it was negative. She took several days of left over augmentin  that she had. Covid-19 and influenza tests are negative.   Past Medical History:  Diagnosis Date   Anxiety    Breast cancer (HCC)    Breast mass 03/26/2017   3 oclock x 1 week    Cancer (HCC) 04/2017   left breast cancer   History of radiation therapy 07/18/17- 09/02/17   Left Breast 50.4 Gy in 28 fractions, Left Breast Boost 10 Gy in 5 fractions.    Personal history of radiation therapy      Family History  Problem Relation Age of Onset   Myelodysplastic syndrome Father    Heart disease Maternal Grandmother    Healthy Mother    Breast cancer Neg Hx     Social History   Social History Narrative   Lives at home with husband.  No grands.      Review of Systems  HENT:  Positive for congestion.   Respiratory:  Positive for cough and sputum production.         Objective:   Vitals: BP 110/80   Pulse 88   Ht 5' 5 (1.651 m)   Wt 142 lb (64.4 kg)   LMP 03/04/2018   SpO2 98%   BMI 23.63  kg/m    Physical Exam Vitals and nursing note reviewed.  Constitutional:      General: She is not in acute distress.    Appearance: Normal appearance. She is not ill-appearing.  HENT:     Head: Normocephalic and atraumatic.     Right Ear: Tympanic membrane, ear canal and external ear normal.     Left Ear: Tympanic membrane, ear canal and external ear normal.     Ears:     Comments: Right TM slightly full but not red  Left TM dull     Mouth/Throat:     Mouth: Mucous membranes are moist.     Pharynx: Oropharynx is clear. Posterior oropharyngeal erythema present. No oropharyngeal exudate.  Pulmonary:     Effort: Pulmonary effort is normal.     Breath sounds: Normal breath sounds. No wheezing, rhonchi or rales.  Lymphadenopathy:     Cervical: No cervical adenopathy.  Skin:    General: Skin is warm and dry.  Neurological:     Mental Status: She is alert and oriented to person, place, and time. Mental status is at baseline.  Psychiatric:        Mood and Affect: Mood normal.        Behavior: Behavior normal.  Thought Content: Thought content normal.        Judgment: Judgment normal.       Results:    Labs:       Component Value Date/Time   NA 138 01/18/2024 1005   K 4.2 01/18/2024 1005   CL 106 01/18/2024 1005   CO2 23 01/18/2024 1005   GLUCOSE 79 01/18/2024 1005   BUN 11 01/18/2024 1005   CREATININE 0.74 01/18/2024 1005   CALCIUM  9.6 01/18/2024 1005   PROT 6.8 01/18/2024 1005   ALBUMIN 4.3 12/08/2015 1122   AST 19 01/18/2024 1005   ALT 17 01/18/2024 1005   ALKPHOS 75 12/08/2015 1122   BILITOT 0.8 01/18/2024 1005   GFRNONAA >60 10/06/2023 1326   GFRNONAA 92 01/29/2020 0950   GFRAA 106 01/29/2020 0950     Lab Results  Component Value Date   WBC 4.3 01/18/2024   HGB 14.5 01/18/2024   HCT 44.1 01/18/2024   MCV 93.8 01/18/2024   PLT 222 01/18/2024    Lab Results  Component Value Date   CHOL 201 (H) 01/18/2024   HDL 86 01/18/2024   LDLCALC 101 (H)  01/18/2024   TRIG 58 01/18/2024   CHOLHDL 2.3 01/18/2024    Lab Results  Component Value Date   HGBA1C 5.1 01/18/2024     Lab Results  Component Value Date   TSH 1.01 01/20/2023        Assessment & Plan:   Orders Placed This Encounter  Procedures   POC Covid19/Flu A&B Antigen   Meds ordered this encounter  Medications   HYDROcodone  bit-homatropine (HYCODAN) 5-1.5 MG/5ML syrup    Sig: Take 5 mLs by mouth every 8 (eight) hours as needed for cough.    Dispense:  120 mL    Refill:  0   azithromycin  (ZITHROMAX ) 250 MG tablet    Sig: Take 2 tablets on day 1, then 1 tablet daily on days 2 through 5    Dispense:  6 tablet    Refill:  0   Acute lower respiratory infection with Cough: She started to feel sick last Wednesday after her husband came back from [place] . She has also been traveling recently. She has a productive cough and congestion. She said that she felt better on Saturday but then her symptoms worsened. She took an at home Covid-19 test and it was negative. She took several days of left over Augmentin  that she had. Covid-19 and influenza tests are negative.   Azithromycin  250 mg two tablets on the first day, One tablet daily on days 2-5 prescribed.   Hycodan 5 mL every 8 hours as needed for cough prescribed.  Rest and stay well-hydrated.  Call if not better in 48 -72 hours or sooner if worse.  Take antibiotic and Hycodan as prescribed.  I,Makayla C Reid,acting as a scribe for Ronal JINNY Hailstone, MD.,have documented all relevant documentation on the behalf of Ronal JINNY Hailstone, MD,as directed by  Ronal JINNY Hailstone, MD while in the presence of Ronal JINNY Hailstone, MD.  I, Ronal JINNY Hailstone, MD, have reviewed all documentation for this visit. The documentation on 08/07/2024 for the exam, diagnosis, procedures, and orders are all accurate and complete.    "

## 2024-08-11 NOTE — Patient Instructions (Addendum)
 COVID-19 and influenza tests are negative today. You have been diagnosed with an acute lower respiratory infection.  Please take Zithromax  Z-PAK 2 tabs day 1 followed by 1 tab days 2 through 5.  May take Hycodan 1 teaspoon every 8 hours as needed for cough.  Rest and stay well-hydrated.  Call if not better in 48 to 72 hours or sooner if worse.

## 2024-08-13 DIAGNOSIS — M7541 Impingement syndrome of right shoulder: Secondary | ICD-10-CM | POA: Diagnosis not present

## 2024-08-13 DIAGNOSIS — M25811 Other specified joint disorders, right shoulder: Secondary | ICD-10-CM | POA: Diagnosis not present

## 2024-08-13 DIAGNOSIS — M7501 Adhesive capsulitis of right shoulder: Secondary | ICD-10-CM | POA: Diagnosis not present

## 2024-08-13 DIAGNOSIS — M7581 Other shoulder lesions, right shoulder: Secondary | ICD-10-CM | POA: Diagnosis not present

## 2024-08-13 DIAGNOSIS — M71311 Other bursal cyst, right shoulder: Secondary | ICD-10-CM | POA: Diagnosis not present

## 2024-08-13 DIAGNOSIS — M19011 Primary osteoarthritis, right shoulder: Secondary | ICD-10-CM | POA: Diagnosis not present

## 2024-08-13 DIAGNOSIS — G8918 Other acute postprocedural pain: Secondary | ICD-10-CM | POA: Diagnosis not present

## 2024-08-13 DIAGNOSIS — S43431A Superior glenoid labrum lesion of right shoulder, initial encounter: Secondary | ICD-10-CM | POA: Diagnosis not present

## 2024-08-13 DIAGNOSIS — M75111 Incomplete rotator cuff tear or rupture of right shoulder, not specified as traumatic: Secondary | ICD-10-CM | POA: Diagnosis not present

## 2024-08-13 DIAGNOSIS — Y999 Unspecified external cause status: Secondary | ICD-10-CM | POA: Diagnosis not present

## 2024-08-13 DIAGNOSIS — S43491A Other sprain of right shoulder joint, initial encounter: Secondary | ICD-10-CM | POA: Diagnosis not present

## 2024-08-14 DIAGNOSIS — M7501 Adhesive capsulitis of right shoulder: Secondary | ICD-10-CM | POA: Diagnosis not present

## 2024-08-14 DIAGNOSIS — S43431D Superior glenoid labrum lesion of right shoulder, subsequent encounter: Secondary | ICD-10-CM | POA: Diagnosis not present

## 2024-08-14 DIAGNOSIS — M19011 Primary osteoarthritis, right shoulder: Secondary | ICD-10-CM | POA: Diagnosis not present

## 2024-08-15 DIAGNOSIS — M19011 Primary osteoarthritis, right shoulder: Secondary | ICD-10-CM | POA: Diagnosis not present

## 2024-08-15 DIAGNOSIS — M7501 Adhesive capsulitis of right shoulder: Secondary | ICD-10-CM | POA: Diagnosis not present

## 2024-08-15 DIAGNOSIS — S43431D Superior glenoid labrum lesion of right shoulder, subsequent encounter: Secondary | ICD-10-CM | POA: Diagnosis not present
